# Patient Record
Sex: Male | Born: 1975 | Race: White | Hispanic: Yes | Marital: Married | Smoking: Current some day smoker
Health system: Southern US, Community
[De-identification: ages and names within clinical notes are randomized; demographics above are authoritative.]

## PROBLEM LIST (undated history)

## (undated) ENCOUNTER — Emergency Department (HOSPITAL_COMMUNITY): Payer: No Typology Code available for payment source

## (undated) DIAGNOSIS — E785 Hyperlipidemia, unspecified: Secondary | ICD-10-CM

## (undated) HISTORY — PX: NO PAST SURGERIES: SHX2092

## (undated) HISTORY — DX: Hyperlipidemia, unspecified: E78.5

---

## 2017-12-16 ENCOUNTER — Ambulatory Visit: Payer: Self-pay | Attending: Family Medicine | Admitting: *Deleted

## 2019-03-06 ENCOUNTER — Ambulatory Visit: Payer: Self-pay | Admitting: Emergency Medicine

## 2019-03-07 ENCOUNTER — Encounter: Payer: Self-pay | Admitting: Emergency Medicine

## 2019-03-08 ENCOUNTER — Encounter: Payer: Self-pay | Admitting: Emergency Medicine

## 2020-04-28 ENCOUNTER — Emergency Department (HOSPITAL_COMMUNITY)
Admission: EM | Admit: 2020-04-28 | Discharge: 2020-04-29 | Disposition: A | Discharge status: Home / Self Care | Payer: Self-pay | Attending: Emergency Medicine | Admitting: Emergency Medicine

## 2020-04-28 ENCOUNTER — Emergency Department (HOSPITAL_COMMUNITY): Payer: Self-pay

## 2020-04-28 DIAGNOSIS — R079 Chest pain, unspecified: Secondary | ICD-10-CM | POA: Insufficient documentation

## 2020-04-28 DIAGNOSIS — R9431 Abnormal electrocardiogram [ECG] [EKG]: Secondary | ICD-10-CM | POA: Insufficient documentation

## 2020-04-28 DIAGNOSIS — Z5321 Procedure and treatment not carried out due to patient leaving prior to being seen by health care provider: Secondary | ICD-10-CM | POA: Insufficient documentation

## 2020-04-28 LAB — CBC
HCT: 44.9 % (ref 39.0–52.0)
Hemoglobin: 15.4 g/dL (ref 13.0–17.0)
MCH: 31 pg (ref 26.0–34.0)
MCHC: 34.3 g/dL (ref 30.0–36.0)
MCV: 90.3 fL (ref 80.0–100.0)
Platelets: 278 10*3/uL (ref 150–400)
RBC: 4.97 MIL/uL (ref 4.22–5.81)
RDW: 11.8 % (ref 11.5–15.5)
WBC: 6 10*3/uL (ref 4.0–10.5)
nRBC: 0 % (ref 0.0–0.2)

## 2020-04-28 LAB — BASIC METABOLIC PANEL
Anion gap: 10 (ref 5–15)
BUN: 13 mg/dL (ref 6–20)
CO2: 25 mmol/L (ref 22–32)
Calcium: 9.1 mg/dL (ref 8.9–10.3)
Chloride: 104 mmol/L (ref 98–111)
Creatinine, Ser: 0.81 mg/dL (ref 0.61–1.24)
GFR, Estimated: >60 mL/min (ref >60)
Glucose, Bld: 97 mg/dL (ref 70–99)
Potassium: 3.8 mmol/L (ref 3.5–5.1)
Sodium: 139 mmol/L (ref 135–145)

## 2020-04-28 LAB — TROPONIN I (HIGH SENSITIVITY)
Troponin I (High Sensitivity): <2 ng/L (ref <18)
Troponin I (High Sensitivity): <2 ng/L (ref <18)

## 2020-04-28 NOTE — ED Triage Notes (Signed)
Pt arrives via [facility] from MD office with chief complaint with CP ongoing for months- was sent here due to abnormal ekg and none to compare too. Currently has no pain and has been pain free for 1 week.   Received no meds with ems, no ekg done with ems.

## 2020-04-29 NOTE — ED Notes (Signed)
Pt called for vitals x2, no answer 

## 2020-09-29 ENCOUNTER — Emergency Department (HOSPITAL_COMMUNITY): Payer: Self-pay

## 2020-09-29 ENCOUNTER — Emergency Department (HOSPITAL_COMMUNITY)
Admission: EM | Admit: 2020-09-29 | Discharge: 2020-09-29 | Disposition: A | Discharge status: Home / Self Care | Payer: Self-pay | Attending: Emergency Medicine | Admitting: Emergency Medicine

## 2020-09-29 ENCOUNTER — Other Ambulatory Visit: Payer: Self-pay

## 2020-09-29 DIAGNOSIS — W1830XA Fall on same level, unspecified, initial encounter: Secondary | ICD-10-CM | POA: Insufficient documentation

## 2020-09-29 DIAGNOSIS — M25511 Pain in right shoulder: Secondary | ICD-10-CM | POA: Insufficient documentation

## 2020-09-29 DIAGNOSIS — M25521 Pain in right elbow: Secondary | ICD-10-CM | POA: Insufficient documentation

## 2020-09-29 DIAGNOSIS — Y990 Civilian activity done for income or pay: Secondary | ICD-10-CM | POA: Insufficient documentation

## 2020-09-29 MED ORDER — NAPROXEN 500 MG PO TABS: 500.0000 mg | ORAL_TABLET | Freq: Two times a day (BID) | ORAL | 0 refills | Status: AC

## 2020-09-29 NOTE — ED Provider Notes (Signed)
[facility] EMERGENCY DEPARTMENT Provider Note   CSN: [id number] Arrival date & time: 09/29/20  1647     History Chief Complaint  Patient presents with  . Arm Pain    Harry Thomas is a 45 y.o. male.  HPI   Patient is a 45 year old male presenting with right shoulder and right elbow pain x 1 week. Reports he fell on outstretched right arm while at work. He fell from flat ground onto gravel. He did not hit his head. He states he has taken advil for the pain but without much relief. He reports limited ROM and that he can't raise his arm above his head. The pain is worsened by movement, and is constant and sharp.    Patient speaks Spanish. Translation services were utilized.   No past medical history on file.  There are no problems to display for this patient.        No family history on file.     Home Medications Prior to Admission medications   Not on File    Allergies    Patient has no known allergies.  Review of Systems   Review of Systems  Constitutional: Negative for chills and fever.  Musculoskeletal: Positive for myalgias.  Neurological: Negative for tremors, weakness and numbness.    Physical Exam Updated Vital Signs BP 125/82 (BP Location: Right Arm)   Pulse 88   Temp 97.9 F (36.6 C) (Oral)   Resp 15   SpO2 100%   Physical Exam Vitals and nursing note reviewed. Exam conducted with a chaperone present.  Constitutional:      General: He is not in acute distress.    Appearance: Normal appearance.     Comments: Patient sitting comfortable.  HENT:     Head: Normocephalic and atraumatic.  Eyes:     General: No scleral icterus.       Right eye: No discharge.        Left eye: No discharge.     Extraocular Movements: Extraocular movements intact.     Pupils: Pupils are equal, round, and reactive to light.  Cardiovascular:     Rate and Rhythm: Normal rate and regular rhythm.     Pulses: Normal pulses.     Heart sounds: Normal  heart sounds. No murmur heard. No friction rub. No gallop.      Comments: Brachial pulses 2+. Equal bilaterally Pulmonary:     Effort: Pulmonary effort is normal. No respiratory distress.     Breath sounds: Normal breath sounds.  Abdominal:     General: Abdomen is flat. Bowel sounds are normal. There is no distension.     Palpations: Abdomen is soft.     Tenderness: There is no abdominal tenderness.  Musculoskeletal:        General: Tenderness present.     Comments: ROM Shoulder: limited abduction and adduction to pain.  ROM Elbow: ROM nl. Some pain with flexion. No crepitus, no swelling, no obvious deformity.  AC joint is TTP.  Skin:    General: Skin is warm and dry.     Coloration: Skin is not jaundiced.  Neurological:     General: No focal deficit present.     Mental Status: He is alert. Mental status is at baseline.     Sensory: No sensory deficit.     Motor: No weakness.     Coordination: Coordination normal.     ED Results / Procedures / Treatments   Labs (all labs ordered  are listed, but only abnormal results are displayed) Labs Reviewed - No data to display  EKG None  Radiology No results found.  Procedures Procedures   Medications Ordered in ED Medications - No data to display  ED Course  I have reviewed the triage vital signs and the nursing notes.  Pertinent labs & imaging results that were available during my care of the patient were reviewed by me and considered in my medical decision making (see chart for details).    MDM Rules/Calculators/A&P                         Vitals stable, PE reassuring. Xrays without evidence of acute fracture. Discharged patient on 2 weeks of naproxen with plans to have him follow up with PCP at home. Return precautions given. Patient voiced understanding and agreement with the plan.   Final Clinical Impression(s) / ED Diagnoses Final diagnoses:  None    Rx / DC Orders ED Discharge Orders    None       Theron Arista, Cordelia Poche 10/03/20 1709    Eber Hong, MD 10/07/20 2112

## 2020-09-29 NOTE — ED Provider Notes (Signed)
The pt fell on outstretched hand last week - has pain in the shoulder and elbow - xrays reviewed and show no acute fractures on my read - normal alignment of joints and normal pulses, no signs of spinal ttp - normal gait - interpreter used.  Awaiting radiology read - no distress, anticipate d/c.  Medical screening examination/treatment/procedure(s) were conducted as a shared visit with non-physician practitioner(s) and myself.  I personally evaluated the patient during the encounter.  Clinical Impression:   Final diagnoses:  Right elbow pain  Right shoulder pain, unspecified chronicity         Eber Hong, MD 09/30/20 1540

## 2020-09-29 NOTE — ED Provider Notes (Signed)
Emergency Medicine Provider Triage Evaluation Note  Harry Thomas , a 45 y.o. male  was evaluated in triage.  Pt complains of right shoulder and elbow pain after fall at work 1 week ago, tripped while waiting and fell onto arm.  Patient is right-hand dominant.  Taking ibuprofen at home however continues to have pain worse with movement..  Review of Systems  Positive: Arm pain Negative: Weakness, numbness  Physical Exam  BP 125/82 (BP Location: Right Arm)   Pulse 88   Temp 97.9 F (36.6 C) (Oral)   Resp 15   SpO2 100%  Gen:   Awake, no distress   Resp:  Normal effort  MSK:   Moves extremities without difficulty  Other:    Medical Decision Making  Medically screening exam initiated at 5:24 PM.  Appropriate orders placed.  Harry Thomas was informed that the remainder of the evaluation will be completed by another provider, this initial triage assessment does not replace that evaluation, and the importance of remaining in the ED until their evaluation is complete.     Jeannie Fend, PA-C 09/29/20 1724    Milagros Loll, MD 09/30/20 [phone]

## 2020-09-29 NOTE — ED Triage Notes (Signed)
Pt here with reports of falling onto R arm last week. Continued pain and decreased ROM.

## 2020-09-29 NOTE — Discharge Instructions (Addendum)
You were seen today for shoulder and arm pain. Your plain film xrays did not show any signs of fracture to the bone. I am prescribing you Naproxen, which is an antiinflammatory medicine to take for the muscle pain. Take it twice daily, once in the morning and once at night. Please return if you develop a fever or if the pain worsens.   Lo vieron hoy por dolor en el hombro y el brazo. Sus radiografas simples no mostraron ningn signo de Development worker, community. Le estoy recetando naproxeno, que es un medicamento antiinflamatorio para Psychiatric nurse. Tmelo dos veces al da, una vez por la maana y otra por la noche. Regrese si tiene fiebre o si el dolor empeora.

## 2020-09-29 NOTE — ED Notes (Signed)
Pt discharged and ambulated out of the ED without difficulty. 

## 2021-05-12 NOTE — Progress Notes (Signed)
This encounter was created in error - please disregard.

## 2021-06-07 ENCOUNTER — Other Ambulatory Visit: Payer: Self-pay

## 2021-06-07 ENCOUNTER — Emergency Department (HOSPITAL_COMMUNITY): Payer: Self-pay

## 2021-06-07 ENCOUNTER — Emergency Department (HOSPITAL_COMMUNITY)
Admission: EM | Admit: 2021-06-07 | Discharge: 2021-06-07 | Disposition: A | Discharge status: Home / Self Care | Payer: Self-pay | Attending: Emergency Medicine | Admitting: Emergency Medicine

## 2021-06-07 ENCOUNTER — Encounter (HOSPITAL_COMMUNITY): Payer: Self-pay | Admitting: Emergency Medicine

## 2021-06-07 DIAGNOSIS — S3992XA Unspecified injury of lower back, initial encounter: Secondary | ICD-10-CM

## 2021-06-07 DIAGNOSIS — S300XXA Contusion of lower back and pelvis, initial encounter: Secondary | ICD-10-CM | POA: Insufficient documentation

## 2021-06-07 DIAGNOSIS — W231XXA Caught, crushed, jammed, or pinched between stationary objects, initial encounter: Secondary | ICD-10-CM | POA: Insufficient documentation

## 2021-06-07 LAB — URINALYSIS, ROUTINE W REFLEX MICROSCOPIC
Bilirubin Urine: NEGATIVE
Glucose, UA: NEGATIVE mg/dL
Hgb urine dipstick: NEGATIVE
Ketones, ur: NEGATIVE mg/dL
Leukocytes,Ua: NEGATIVE
Nitrite: NEGATIVE
Protein, ur: NEGATIVE mg/dL
Specific Gravity, Urine: 1.01 (ref 1.005–1.030)
pH: 6 (ref 5.0–8.0)

## 2021-06-07 MED ORDER — ACETAMINOPHEN 325 MG PO TABS: 650.0000 mg | ORAL_TABLET | Freq: Four times a day (QID) | ORAL | 0 refills | Status: AC | PRN

## 2021-06-07 MED ORDER — LIDOCAINE 5 % EX PTCH: 1.0000 | MEDICATED_PATCH | Freq: Every day | CUTANEOUS | 0 refills | Status: AC | PRN

## 2021-06-07 MED ORDER — KETOROLAC TROMETHAMINE 60 MG/2ML IM SOLN
60.0000 mg | Freq: Once | INTRAMUSCULAR | Status: AC
  Administered 2021-06-07: 60 mg via INTRAMUSCULAR
  Filled 2021-06-07: qty 2

## 2021-06-07 MED ORDER — IBUPROFEN 600 MG PO TABS: 600.0000 mg | ORAL_TABLET | Freq: Four times a day (QID) | ORAL | 0 refills | Status: DC | PRN

## 2021-06-07 MED ORDER — HYDROCODONE-ACETAMINOPHEN 5-325 MG PO TABS
1.0000 | ORAL_TABLET | Freq: Once | ORAL | Status: AC
  Administered 2021-06-07: 1 via ORAL
  Filled 2021-06-07: qty 1

## 2021-06-07 MED ORDER — OXYCODONE HCL 5 MG PO TABS: 5.0000 mg | ORAL_TABLET | ORAL | 0 refills | Status: AC | PRN

## 2021-06-07 MED ORDER — LIDOCAINE 5 % EX PTCH
1.0000 | MEDICATED_PATCH | CUTANEOUS | Status: DC
  Administered 2021-06-07: 1 via TRANSDERMAL
  Filled 2021-06-07: qty 1

## 2021-06-07 MED ORDER — METHOCARBAMOL 500 MG PO TABS: 1000.0000 mg | ORAL_TABLET | Freq: Two times a day (BID) | ORAL | 0 refills | Status: AC

## 2021-06-07 NOTE — ED Triage Notes (Signed)
[employer] used for triage.  PT states he was carrying a washer from the 2nd floor yesterday.  The washer slipped from the person on tops hands and the washer fell on pt's stomach.  C/o lower back pain.  Taking Ibuprofen 800mg  without relief.  Denies hematuria.  Reports pain with urination.

## 2021-06-07 NOTE — ED Provider Notes (Addendum)
[facility] EMERGENCY DEPARTMENT Provider Note   CSN: [id number] Arrival date & time: 06/07/21  1239     History  Chief Complaint  Patient presents with   Back Pain    Harry Thomas is a 46 y.o. male.  This is a 46 y.o. male without significant medical history as below who presents to the ED with complaint of back injury.  Patient reports yesterday he was moving a washing machine in a domicile.  Wash machine fell down the stairs and pinned the patient between the wall and the machine.  He experienced pain to his abdomen and low back following this event.  He is able to ambulate following the accident.  No numbness or tingling, no loss of bowel or bladder control, no LOC.  No vomiting.  No hematuria or blood per rectum.  No chest pain or dyspnea.  Pain has been progressively worsening since the onset yesterday.  He is ambulating but having some difficulty with walking secondary to the ongoing pain.  No history of prior spinal surgeries.  No fevers or chills.  Pain described as aching, sharp, stabbing.  No numbness down the legs.  No other acute complaints offered   Spanish interpreter utilized for evaluation/history    History reviewed. No pertinent past medical history.     The history is provided by the patient. The history is limited by a language barrier. A language interpreter was used.  Back Pain Associated symptoms: no abdominal pain, no chest pain, no fever and no headaches       Home Medications Prior to Admission medications   Medication Sig Start Date End Date Taking? Authorizing Provider  acetaminophen (TYLENOL) 325 MG tablet Take 2 tablets (650 mg total) by mouth every 6 (six) hours as needed. 06/07/21  Yes Wynona Dove A, DO  ibuprofen (ADVIL) 600 MG tablet Take 1 tablet (600 mg total) by mouth every 6 (six) hours as needed. 06/07/21  Yes Wynona Dove A, DO  lidocaine (LIDODERM) 5 % Place 1 patch onto the skin daily as needed for up to 15 doses.  Remove & Discard patch within 12 hours or as directed by MD 06/07/21  Yes Jeanell Sparrow, DO  methocarbamol (ROBAXIN) 500 MG tablet Take 2 tablets (1,000 mg total) by mouth 2 (two) times daily for 5 days. 06/07/21 06/12/21 Yes Wynona Dove A, DO  oxyCODONE (ROXICODONE) 5 MG immediate release tablet Take 1 tablet (5 mg total) by mouth every 4 (four) hours as needed for severe pain. 06/07/21  Yes Jeanell Sparrow, DO  multivitamin (ONE-A-DAY MEN'S) TABS tablet Take 1 tablet by mouth daily with breakfast.    [provider]  naproxen (NAPROSYN) 500 MG tablet Take 1 tablet (500 mg total) by mouth 2 (two) times daily. 09/29/20   Sherrill Raring, PA-C      Allergies    Other    Review of Systems   Review of Systems  Constitutional:  Negative for chills and fever.  HENT:  Negative for facial swelling and trouble swallowing.   Eyes:  Negative for photophobia and visual disturbance.  Respiratory:  Negative for cough and shortness of breath.   Cardiovascular:  Negative for chest pain and palpitations.  Gastrointestinal:  Negative for abdominal pain, nausea and vomiting.  Endocrine: Negative for polydipsia and polyuria.  Genitourinary:  Negative for frequency, hematuria, penile swelling, scrotal swelling and testicular pain.  Musculoskeletal:  Positive for arthralgias, back pain and gait problem. Negative for joint swelling.  Skin:  Negative for pallor and rash.  Neurological:  Negative for syncope and headaches.  Psychiatric/Behavioral:  Negative for agitation and confusion.    Physical Exam Updated Vital Signs BP 103/81 (BP Location: Right Arm)    Pulse 66    Temp 97.9 F (36.6 C) (Oral)    Resp 16    SpO2 100%  Physical Exam Vitals and nursing note reviewed.  Constitutional:      General: He is not in acute distress.    Appearance: Normal appearance. He is well-developed. He is not ill-appearing, toxic-appearing or diaphoretic.  HENT:     Head: Normocephalic and atraumatic.     Right Ear:  External ear normal.     Left Ear: External ear normal.     Mouth/Throat:     Mouth: Mucous membranes are moist.  Eyes:     General: No scleral icterus.    Extraocular Movements: Extraocular movements intact.     Pupils: Pupils are equal, round, and reactive to light.  Cardiovascular:     Rate and Rhythm: Normal rate and regular rhythm.     Pulses: Normal pulses.     Heart sounds: Normal heart sounds.  Pulmonary:     Effort: Pulmonary effort is normal. No respiratory distress.     Breath sounds: Normal breath sounds.  Abdominal:     General: Abdomen is flat.     Palpations: Abdomen is soft.     Tenderness: There is no abdominal tenderness. There is no guarding or rebound.  Musculoskeletal:        General: Normal range of motion.       Arms:     Cervical back: Normal range of motion.     Right lower leg: No edema.     Left lower leg: No edema.     Comments: Hematoma to lumbar spine.  Area is tender palpation.  No tissue breakdown.  No crepitus or step-off.  Rectal tone intact  Skin:    General: Skin is warm and dry.     Capillary Refill: Capillary refill takes less than 2 seconds.  Neurological:     General: No focal deficit present.     Mental Status: He is alert and oriented to person, place, and time.     GCS: GCS eye subscore is 4. GCS verbal subscore is 5. GCS motor subscore is 6.     Cranial Nerves: Cranial nerves 2-12 are intact. No dysarthria.     Sensory: Sensation is intact.     Motor: Motor function is intact. No tremor.     Coordination: Coordination is intact.     Gait: Gait abnormal (slight antalgic gait).  Psychiatric:        Mood and Affect: Mood normal.        Behavior: Behavior normal.    ED Results / Procedures / Treatments   Labs (all labs ordered are listed, but only abnormal results are displayed) Labs Reviewed  URINALYSIS, ROUTINE W REFLEX MICROSCOPIC    EKG None  Radiology CT ABDOMEN PELVIS WO CONTRAST  Result Date: 06/07/2021 CLINICAL  DATA:  Lower back pain, dysuria, heavy object fell on him yesterday EXAM: CT ABDOMEN AND PELVIS WITHOUT CONTRAST TECHNIQUE: Multidetector CT imaging of the abdomen and pelvis was performed following the standard protocol without IV contrast. Unenhanced CT was performed per clinician order. Lack of IV contrast limits sensitivity and specificity, especially for evaluation of abdominal/pelvic solid viscera. RADIATION DOSE REDUCTION: This exam was performed according to the departmental dose-optimization program  which includes automated exposure control, adjustment of the mA and/or kV according to patient size and/or use of iterative reconstruction technique. COMPARISON:  None. FINDINGS: Lower chest: No acute pleural or parenchymal lung disease. Hepatobiliary: Unremarkable unenhanced appearance of the liver and gallbladder. Pancreas: Unremarkable unenhanced appearance. Spleen: Unremarkable unenhanced appearance. Adrenals/Urinary Tract: No urinary tract calculi or obstructive uropathy. The adrenals and bladder are unremarkable. Stomach/Bowel: No bowel obstruction or ileus. Normal appendix right lower quadrant. No bowel wall thickening or inflammatory change. Vascular/Lymphatic: No significant vascular findings are present. No enlarged abdominal or pelvic lymph nodes. Reproductive: Prostate is unremarkable. Other: No free intraperitoneal fluid or free gas. No abdominal wall hernia. Musculoskeletal: No acute or destructive bony lesions. There is subcutaneous edema within the midline lower back. Reconstructed images demonstrate no additional findings. IMPRESSION: 1. Subcutaneous edema midline lower back, consistent with history of trauma. No fluid collection or evidence of hematoma. 2. No acute displaced fracture. 3. Otherwise unremarkable unenhanced CT of the abdomen and pelvis. Electronically Signed   By: Randa Ngo M.D.   On: 06/07/2021 16:31   DG Lumbar Spine Complete  Result Date: 06/07/2021 CLINICAL DATA:   Back pain post blunt trauma. EXAM: LUMBAR SPINE - COMPLETE 4+ VIEW COMPARISON:  None. FINDINGS: There is no evidence of lumbar spine fracture. Alignment is normal. Intervertebral disc spaces are maintained. IMPRESSION: Negative. Electronically Signed   By: Fidela Salisbury M.D.   On: 06/07/2021 13:51    Procedures Procedures    Medications Ordered in ED Medications  ketorolac (TORADOL) injection 60 mg (has no administration in time range)  lidocaine (LIDODERM) 5 % 1 patch (has no administration in time range)  HYDROcodone-acetaminophen (NORCO/VICODIN) 5-325 MG per tablet 1 tablet (1 tablet Oral Given 06/07/21 1541)    ED Course/ Medical Decision Making/ A&P                           Medical Decision Making Problems Addressed: Injury of low back, initial encounter: acute illness or injury  Amount and/or Complexity of Data Reviewed Labs: ordered. Decision-making details documented in ED Course.    Details: UA - negative Radiology: ordered and independent interpretation performed. Decision-making details documented in ED Course.  Risk OTC drugs. Prescription drug management.    CC: Back injury  This patient complains of back injury; this involves an extensive number of treatment options and is a complaint that carries with it a high risk of complications and morbidity. Vital signs were reviewed. Serious etiologies considered.  Record review:  Previous records obtained and reviewed    Work up as above, notable for:  Labs & imaging results that were available during my care of the patient were reviewed by me and considered in my medical decision making.   Patient with midline tenderness palpation to lumbar spine.  Recent trauma.  Lumbar x-ray nonacute.  Will obtain CT imaging  I ordered imaging studies which included CT lumbar, CT abdomen pelvis and I independently visualized and interpreted imaging which showed subcutaneous edema midline lower back.  No  fracture.  Management: Patient given oral analgesics, Toradol, lidocaine patch  Reassessment:  Patient reports symptoms have improved following intervention.  He is ambulatory, he is tolerating oral intake.  He is neurologically intact.  Patient with swelling secondary to recent traumatic injury.  Pain likely due to soft tissue injury associated with trauma.  Discussed symptomatic care at home and strict return precautions.  Close outpatient follow-up with PCP.  Patient presents  with low back pain without signs of spinal cord compression, cauda equina syndrome, infection, aneurysm, or other serious etiology. The patient is neurologically intact. Given the extremely low risk of these diagnoses further testing and evaluation for these possibilities does not appear to be indicated at this time. Detailed discussions were had with the patient and/or family and caregivers, regarding current findings, and need for close f/u with PCP or on call doctor. The patient has been instructed to return immediately if the symptoms worsen in any way. Patient verbalized understanding and is in agreement with current care plan. All questions answered prior to discharge.   Social determinants of health-tobacco use, Spanish-speaking        This chart was dictated using Armed forces training and education officer.  Despite best efforts to proofread,  errors can occur which can change the documentation meaning.         Final Clinical Impression(s) / ED Diagnoses Final diagnoses:  Injury of low back, initial encounter    Rx / DC Orders ED Discharge Orders          Ordered    methocarbamol (ROBAXIN) 500 MG tablet  2 times daily        06/07/21 1717    lidocaine (LIDODERM) 5 %  Daily PRN        06/07/21 1717    ibuprofen (ADVIL) 600 MG tablet  Every 6 hours PRN        06/07/21 1717    acetaminophen (TYLENOL) 325 MG tablet  Every 6 hours PRN        06/07/21 1717    oxyCODONE (ROXICODONE) 5 MG immediate release  tablet  Every 4 hours PRN        06/07/21 1730              Jeanell Sparrow, DO 06/07/21 1739    Jeanell Sparrow, DO 06/07/21 1746

## 2021-06-07 NOTE — Discharge Instructions (Addendum)
°  Fue un placer atenderlo [employer] en el departamento de emergencias.  Regrese al departamento de emergencias si hay algn empeoramiento o sntomas preocupantes.   Haga un seguimiento con su mdico de atencin primaria dentro de la prxima semana. Tome sus medicamentos segn las instrucciones y Conservation officer, nature cambio en sus medicamentos con su mdico de atencin primaria.   Regrese al departamento de emergencias si tiene entumecimiento en las piernas, debilidad en las piernas, dificultad para caminar, fiebre, empeoramiento del [place], [place], prdida del conocimiento, dolor abdominal intenso, dolor de cabeza intenso, dificultad para orinar o dificultad para defecar.   Regrese al departamento de emergencias de inmediato por cualquier sntoma nuevo o preocupante, o si empeora.

## 2021-06-07 NOTE — ED Provider Triage Note (Signed)
Emergency Medicine Provider Triage Evaluation Note  Harry Thomas , a 46 y.o. male  was evaluated in triage.  Pt complains of lower back pain after a wash machine fell on him yesterday.  Patient states he was moving a washer when they lost control and he fell backwards and fell onto him walking down some stairs.  He denies any weakness or numbness to his legs, bladder incontinence, bowel incontinence.  Does report associated dysuria.  Review of Systems  Positive:  Negative: See above   Physical Exam  There were no vitals taken for this visit. Gen:   Awake, no distress   Resp:  Normal effort  MSK:   Moves extremities without difficulty  Other:  Midline lumbar spinal tenderness  Medical Decision Making  Medically screening exam initiated at 1:30 PM.  Appropriate orders placed.  Harry Thomas was informed that the remainder of the evaluation will be completed by another provider, this initial triage assessment does not replace that evaluation, and the importance of remaining in the ED until their evaluation is complete.     Harry Thomas, [place] 06/07/21 1331

## 2021-09-21 ENCOUNTER — Other Ambulatory Visit: Payer: Self-pay | Admitting: Obstetrics and Gynecology

## 2021-09-21 ENCOUNTER — Ambulatory Visit
Admission: RE | Admit: 2021-09-21 | Discharge: 2021-09-21 | Disposition: A | Discharge status: Home / Self Care | Payer: Self-pay | Source: Ambulatory Visit | Attending: Obstetrics and Gynecology | Admitting: Obstetrics and Gynecology

## 2021-09-21 DIAGNOSIS — R7611 Nonspecific reaction to tuberculin skin test without active tuberculosis: Secondary | ICD-10-CM

## 2022-06-08 DIAGNOSIS — M549 Dorsalgia, unspecified: Secondary | ICD-10-CM | POA: Insufficient documentation

## 2022-06-08 DIAGNOSIS — M25512 Pain in left shoulder: Secondary | ICD-10-CM | POA: Insufficient documentation

## 2022-06-08 DIAGNOSIS — Y92410 Unspecified street and highway as the place of occurrence of the external cause: Secondary | ICD-10-CM | POA: Insufficient documentation

## 2022-06-09 ENCOUNTER — Emergency Department (HOSPITAL_COMMUNITY): Payer: No Typology Code available for payment source

## 2022-06-09 ENCOUNTER — Emergency Department (HOSPITAL_COMMUNITY)
Admission: EM | Admit: 2022-06-09 | Discharge: 2022-06-09 | Disposition: A | Discharge status: Home / Self Care | Payer: No Typology Code available for payment source | Attending: Emergency Medicine | Admitting: Emergency Medicine

## 2022-06-09 ENCOUNTER — Other Ambulatory Visit: Payer: Self-pay

## 2022-06-09 ENCOUNTER — Encounter (HOSPITAL_COMMUNITY): Payer: Self-pay | Admitting: Emergency Medicine

## 2022-06-09 MED ORDER — ACETAMINOPHEN 500 MG PO TABS
1000.0000 mg | ORAL_TABLET | ORAL | Status: AC
  Administered 2022-06-09: 1000 mg via ORAL
  Filled 2022-06-09: qty 2

## 2022-06-09 MED ORDER — METHOCARBAMOL 500 MG PO TABS: 500.0000 mg | ORAL_TABLET | Freq: Two times a day (BID) | ORAL | 0 refills | Status: AC
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
## 2022-06-09 NOTE — Discharge Instructions (Signed)
Your xrays are without any fractures. As we discussed, a CT scan could be used to ensure that there is not a fracture.  Take Tylenol Motrin as discussed below.  Use the muscle relaxer Robaxin.  Hydrate, use warm compresses and lidocaine patches Tiger balm or IcyHot.  Please use Tylenol or ibuprofen for pain.  You may use 600 mg ibuprofen every 6 hours or 1000 mg of Tylenol every 6 hours.  You may choose to alternate between the 2.  This would be most effective.  Not to exceed 4 g of Tylenol within 24 hours.  Not to exceed 3200 mg ibuprofen 24 hours.
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
## 2022-09-02 ENCOUNTER — Emergency Department (HOSPITAL_COMMUNITY)
Admission: EM | Admit: 2022-09-02 | Discharge: 2022-09-02 | Disposition: A | Discharge status: Home / Self Care | Payer: No Typology Code available for payment source | Attending: Emergency Medicine | Admitting: Emergency Medicine

## 2022-09-02 ENCOUNTER — Emergency Department (HOSPITAL_COMMUNITY): Payer: No Typology Code available for payment source

## 2022-09-02 ENCOUNTER — Encounter (HOSPITAL_COMMUNITY): Payer: Self-pay

## 2022-09-02 DIAGNOSIS — S60022A Contusion of left index finger without damage to nail, initial encounter: Secondary | ICD-10-CM | POA: Insufficient documentation

## 2022-09-02 DIAGNOSIS — S3992XA Unspecified injury of lower back, initial encounter: Secondary | ICD-10-CM | POA: Diagnosis present

## 2022-09-02 DIAGNOSIS — S39012A Strain of muscle, fascia and tendon of lower back, initial encounter: Secondary | ICD-10-CM | POA: Insufficient documentation

## 2022-09-02 DIAGNOSIS — Y92410 Unspecified street and highway as the place of occurrence of the external cause: Secondary | ICD-10-CM | POA: Insufficient documentation

## 2022-09-02 MED ORDER — IBUPROFEN 800 MG PO TABS: 800.0000 mg | ORAL_TABLET | Freq: Three times a day (TID) | ORAL | 0 refills | Status: DC | PRN

## 2022-09-02 MED ORDER — IBUPROFEN 800 MG PO TABS: 800.0000 mg | ORAL_TABLET | Freq: Three times a day (TID) | ORAL | 0 refills | Status: AC | PRN
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
## 2022-09-02 NOTE — Discharge Instructions (Signed)
Follow-up with your family doctor for any problems.  And get your blood pressure rechecked in the next 2 to 3 weeks because your blood pressure is mildly elevated
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
## 2022-09-05 ENCOUNTER — Encounter (HOSPITAL_COMMUNITY): Payer: Self-pay

## 2022-09-05 ENCOUNTER — Emergency Department (HOSPITAL_COMMUNITY)
Admission: EM | Admit: 2022-09-05 | Discharge: 2022-09-05 | Disposition: A | Discharge status: Home / Self Care | Payer: No Typology Code available for payment source | Attending: Emergency Medicine | Admitting: Emergency Medicine

## 2022-09-05 ENCOUNTER — Emergency Department (HOSPITAL_COMMUNITY): Payer: No Typology Code available for payment source

## 2022-09-05 DIAGNOSIS — M542 Cervicalgia: Secondary | ICD-10-CM | POA: Diagnosis not present

## 2022-09-05 DIAGNOSIS — S39012A Strain of muscle, fascia and tendon of lower back, initial encounter: Secondary | ICD-10-CM

## 2022-09-05 DIAGNOSIS — M549 Dorsalgia, unspecified: Secondary | ICD-10-CM | POA: Diagnosis present

## 2022-09-05 DIAGNOSIS — M62830 Muscle spasm of back: Secondary | ICD-10-CM

## 2022-09-05 MED ORDER — OXYCODONE HCL 5 MG PO TABS
5.0000 mg | ORAL_TABLET | Freq: Once | ORAL | Status: AC
  Administered 2022-09-05: 5 mg via ORAL
  Filled 2022-09-05: qty 1

## 2022-09-05 MED ORDER — DIAZEPAM 2 MG PO TABS
2.0000 mg | ORAL_TABLET | Freq: Once | ORAL | Status: AC
  Administered 2022-09-05: 2 mg via ORAL
  Filled 2022-09-05: qty 1

## 2022-09-05 MED ORDER — DIAZEPAM 5 MG PO TABS: 2.5000 mg | ORAL_TABLET | Freq: Four times a day (QID) | ORAL | 0 refills | Status: AC | PRN

## 2022-09-05 MED ORDER — OXYCODONE HCL 5 MG PO TABS: 2.5000 mg | ORAL_TABLET | ORAL | 0 refills | Status: AC | PRN
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
## 2022-09-05 NOTE — Discharge Instructions (Signed)
Comunquese con un mdico si: El dolor de espalda no mejora despus de varias semanas de [place]. Sus sntomas empeoran. Tiene fiebre. Solicite ayuda de inmediato si: El dolor de espalda es muy intenso. Nota que no puede pararse o caminar. Siente dolor en las piernas. Siente debilidad en las nalgas o en las piernas. Tiene problemas para controlar la miccin o las deposiciones. Tiene miccin frecuente, dolorosa o con sangre. Esta informacin no tiene Theme park manager el consejo del mdico. Asegrese de hacerle al mdico cualquier pregunta que tenga.
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
## 2022-09-29 ENCOUNTER — Ambulatory Visit: Payer: No Typology Code available for payment source | Attending: Student

## 2022-09-29 DIAGNOSIS — M5459 Other low back pain: Secondary | ICD-10-CM | POA: Diagnosis present

## 2022-09-29 DIAGNOSIS — M25551 Pain in right hip: Secondary | ICD-10-CM | POA: Diagnosis present

## 2022-09-29 DIAGNOSIS — M6281 Muscle weakness (generalized): Secondary | ICD-10-CM | POA: Diagnosis present
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
## 2022-10-12 ENCOUNTER — Ambulatory Visit: Payer: No Typology Code available for payment source

## 2022-10-12 DIAGNOSIS — M6281 Muscle weakness (generalized): Secondary | ICD-10-CM

## 2022-10-12 DIAGNOSIS — M25551 Pain in right hip: Secondary | ICD-10-CM

## 2022-10-12 DIAGNOSIS — M5459 Other low back pain: Secondary | ICD-10-CM

## 2022-10-14 ENCOUNTER — Ambulatory Visit: Payer: No Typology Code available for payment source

## 2022-10-14 DIAGNOSIS — M5459 Other low back pain: Secondary | ICD-10-CM | POA: Diagnosis not present

## 2022-10-14 DIAGNOSIS — M6281 Muscle weakness (generalized): Secondary | ICD-10-CM

## 2022-10-14 DIAGNOSIS — M25551 Pain in right hip: Secondary | ICD-10-CM
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
## 2022-10-20 ENCOUNTER — Telehealth: Payer: Self-pay

## 2022-10-20 ENCOUNTER — Ambulatory Visit: Payer: No Typology Code available for payment source

## 2022-10-20 DIAGNOSIS — M5459 Other low back pain: Secondary | ICD-10-CM

## 2022-10-20 DIAGNOSIS — M6281 Muscle weakness (generalized): Secondary | ICD-10-CM

## 2022-10-20 DIAGNOSIS — M25551 Pain in right hip: Secondary | ICD-10-CM

## 2022-10-20 NOTE — Telephone Encounter (Signed)
Spoke with patient through interpreter to discuss need to contact MD office regarding referral to [facility] to address L hand.  Patient expressed understanding and will f/u with any questions or concerns at future PT appointments.  He will reach out to PCP first, declined to contact [facility] directly.
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
## 2022-10-21 ENCOUNTER — Ambulatory Visit: Payer: No Typology Code available for payment source

## 2022-10-21 DIAGNOSIS — M6281 Muscle weakness (generalized): Secondary | ICD-10-CM

## 2022-10-21 DIAGNOSIS — M25551 Pain in right hip: Secondary | ICD-10-CM

## 2022-10-21 DIAGNOSIS — M5459 Other low back pain: Secondary | ICD-10-CM
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
## 2022-10-25 ENCOUNTER — Ambulatory Visit: Payer: Self-pay | Attending: Student

## 2022-10-25 DIAGNOSIS — R278 Other lack of coordination: Secondary | ICD-10-CM | POA: Insufficient documentation

## 2022-10-25 DIAGNOSIS — M79645 Pain in left finger(s): Secondary | ICD-10-CM | POA: Insufficient documentation

## 2022-10-25 DIAGNOSIS — M25551 Pain in right hip: Secondary | ICD-10-CM | POA: Insufficient documentation

## 2022-10-25 DIAGNOSIS — M25642 Stiffness of left hand, not elsewhere classified: Secondary | ICD-10-CM | POA: Insufficient documentation

## 2022-10-25 DIAGNOSIS — M5459 Other low back pain: Secondary | ICD-10-CM | POA: Insufficient documentation

## 2022-10-25 DIAGNOSIS — M6281 Muscle weakness (generalized): Secondary | ICD-10-CM | POA: Insufficient documentation

## 2022-10-26 ENCOUNTER — Ambulatory Visit (INDEPENDENT_AMBULATORY_CARE_PROVIDER_SITE_OTHER): Payer: Self-pay | Admitting: Orthopaedic Surgery

## 2022-10-26 DIAGNOSIS — M79642 Pain in left hand: Secondary | ICD-10-CM
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
## 2022-10-28 ENCOUNTER — Ambulatory Visit: Payer: Self-pay

## 2022-10-28 DIAGNOSIS — M5459 Other low back pain: Secondary | ICD-10-CM

## 2022-10-28 DIAGNOSIS — M25551 Pain in right hip: Secondary | ICD-10-CM

## 2022-10-28 DIAGNOSIS — M6281 Muscle weakness (generalized): Secondary | ICD-10-CM
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
## 2022-11-01 ENCOUNTER — Ambulatory Visit: Payer: Self-pay

## 2022-11-01 DIAGNOSIS — M5459 Other low back pain: Secondary | ICD-10-CM

## 2022-11-01 DIAGNOSIS — M6281 Muscle weakness (generalized): Secondary | ICD-10-CM

## 2022-11-01 DIAGNOSIS — M25551 Pain in right hip: Secondary | ICD-10-CM

## 2022-11-02 ENCOUNTER — Other Ambulatory Visit: Payer: Self-pay

## 2022-11-02 ENCOUNTER — Ambulatory Visit: Payer: Self-pay | Admitting: Occupational Therapy

## 2022-11-02 ENCOUNTER — Encounter: Payer: Self-pay | Admitting: Occupational Therapy

## 2022-11-02 DIAGNOSIS — R278 Other lack of coordination: Secondary | ICD-10-CM

## 2022-11-02 DIAGNOSIS — M79645 Pain in left finger(s): Secondary | ICD-10-CM

## 2022-11-02 DIAGNOSIS — M25642 Stiffness of left hand, not elsewhere classified: Secondary | ICD-10-CM

## 2022-11-02 DIAGNOSIS — M6281 Muscle weakness (generalized): Secondary | ICD-10-CM
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
## 2022-11-04 ENCOUNTER — Ambulatory Visit: Payer: Self-pay

## 2022-11-04 DIAGNOSIS — M25551 Pain in right hip: Secondary | ICD-10-CM

## 2022-11-04 DIAGNOSIS — M5459 Other low back pain: Secondary | ICD-10-CM

## 2022-11-04 DIAGNOSIS — M6281 Muscle weakness (generalized): Secondary | ICD-10-CM
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
## 2022-11-08 ENCOUNTER — Ambulatory Visit: Payer: Self-pay | Admitting: Occupational Therapy

## 2022-11-08 DIAGNOSIS — M79645 Pain in left finger(s): Secondary | ICD-10-CM

## 2022-11-08 DIAGNOSIS — M6281 Muscle weakness (generalized): Secondary | ICD-10-CM

## 2022-11-08 DIAGNOSIS — M25642 Stiffness of left hand, not elsewhere classified: Secondary | ICD-10-CM

## 2022-11-08 NOTE — Patient Instructions (Addendum)
Access Code: [email or web address] URL: [email or web address] Date: 11/08/2022 Prepared by: Amada Kingfisher  Exercises - Putty Squeezes  - 1 x daily - 2 sets - 10 reps - Tip PUSH with Putty  - 1 x daily - 2 sets - 10 reps - Finger Extension with Putty  - 1 x daily - 2 sets - 10 reps - Seated Finger Extension with Putty  - 1 x daily - 2 sets - 10 reps - Finger Strengthening: Hook and [employer] in Putty  - 1 x daily - 2 sets - 10 reps - 3-Point Pinch with Putty  - 1 x daily - 2 sets - 10 reps  Access Code: [email or web address] URL: [email or web address] Date: 11/08/2022 Prepared by: Amada Kingfisher  Exercises - Standing Forearm Pronation and Supination with Hammer  - 2 x daily - 10 reps
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
## 2022-11-09 ENCOUNTER — Ambulatory Visit: Payer: Self-pay | Admitting: Occupational Therapy

## 2022-11-09 ENCOUNTER — Ambulatory Visit: Payer: Self-pay

## 2022-11-09 DIAGNOSIS — R278 Other lack of coordination: Secondary | ICD-10-CM

## 2022-11-09 DIAGNOSIS — M5459 Other low back pain: Secondary | ICD-10-CM

## 2022-11-09 DIAGNOSIS — M79645 Pain in left finger(s): Secondary | ICD-10-CM

## 2022-11-09 DIAGNOSIS — M25642 Stiffness of left hand, not elsewhere classified: Secondary | ICD-10-CM

## 2022-11-09 DIAGNOSIS — M6281 Muscle weakness (generalized): Secondary | ICD-10-CM

## 2022-11-09 DIAGNOSIS — M25551 Pain in right hip: Secondary | ICD-10-CM
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
## 2022-11-09 NOTE — Patient Instructions (Addendum)
Finger ROM  Printed in Spanish for patient  Access Code: [email or web address] URL: [email or web address] Date: 11/09/2022 Prepared by: Amada Kingfisher  Exercises  - Seated Finger Composite Flexion Extension  - 2 x daily - 10 reps - Seated Claw Fist AROM  - 2 x daily - 10 reps - Seated Full Fist AROM  - 2 x daily - 10 reps   Coordination Activities  Perform the following activities for a few minutes a couple of times per day with left hand(s).  Rotate ball in fingertips (clockwise and counter-clockwise). Flip cards 1 at a time as fast as you can. Deal cards with your thumb (Hold deck in hand and push card off top with thumb). Shuffle cards. Pick up coins, buttons, marbles, dried beans/pasta of different sizes and place in container. Pick up coins and place in container or coin bank. Pick up coins one at a time until you get 5-10 in your hand, then move coins from palm to fingertips to stack one at a time. Twirl pen between fingers.    Spanish list Actividades de coordinacin    Biomedical scientist en las yemas de los dedos (en sentido horario y en sentido contrario).   Dar la vuelta a las cartas una a una tan rpido como puedas.   Repartir cartas con Multimedia programmer y el dedo (sostener la baraja en la mano y empujar la carta desde arriba con el pulgar).   Barajar las cartas.   Recoger monedas, botones, canicas, tuercas/tornillos de diferentes tamaos y colocarlos en un recipiente.   Recoge las monedas y colcalas en un recipiente o alcanca.   Toma las monedas una por una hasta [place] [address] y 10 en tu mano, luego [employer] las monedas de la palma a las yemas de los dedos para apilarlas una por [place].   Gira el bolgrafo [employer] dedos.
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
## 2022-11-15 ENCOUNTER — Ambulatory Visit: Payer: Self-pay | Admitting: Occupational Therapy

## 2022-11-15 ENCOUNTER — Ambulatory Visit: Payer: Self-pay

## 2022-11-15 DIAGNOSIS — M5459 Other low back pain: Secondary | ICD-10-CM

## 2022-11-15 DIAGNOSIS — M25642 Stiffness of left hand, not elsewhere classified: Secondary | ICD-10-CM

## 2022-11-15 DIAGNOSIS — M6281 Muscle weakness (generalized): Secondary | ICD-10-CM

## 2022-11-15 DIAGNOSIS — R278 Other lack of coordination: Secondary | ICD-10-CM

## 2022-11-15 DIAGNOSIS — M25551 Pain in right hip: Secondary | ICD-10-CM
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
## 2022-11-17 ENCOUNTER — Ambulatory Visit: Payer: Self-pay

## 2022-11-17 ENCOUNTER — Ambulatory Visit: Payer: Self-pay | Admitting: Occupational Therapy

## 2022-11-17 DIAGNOSIS — M6281 Muscle weakness (generalized): Secondary | ICD-10-CM

## 2022-11-17 DIAGNOSIS — M5459 Other low back pain: Secondary | ICD-10-CM

## 2022-11-17 DIAGNOSIS — R278 Other lack of coordination: Secondary | ICD-10-CM

## 2022-11-17 DIAGNOSIS — M25642 Stiffness of left hand, not elsewhere classified: Secondary | ICD-10-CM

## 2022-11-17 NOTE — Patient Instructions (Signed)
Theraband for UE strength and L UE grip  Access Code: [email or web address] URL: [email or web address] Date: 11/17/2022 Prepared by: Amada Kingfisher  Exercises - Seated Shoulder Flexion with Self-Anchored Resistance  - 1 x daily - 2 sets - 10 reps - Seated Elbow Flexion with Self-Anchored Resistance  - 1 x daily - 2 sets - 10 reps - Standing Lat Pull Down with Resistance - Elbows Bent  - 1 x daily - 2 sets - 10 reps
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
## 2022-11-22 ENCOUNTER — Ambulatory Visit: Payer: Self-pay | Attending: Student | Admitting: Occupational Therapy

## 2022-11-22 ENCOUNTER — Ambulatory Visit: Payer: Self-pay

## 2022-11-22 DIAGNOSIS — M25642 Stiffness of left hand, not elsewhere classified: Secondary | ICD-10-CM | POA: Insufficient documentation

## 2022-11-22 DIAGNOSIS — M5459 Other low back pain: Secondary | ICD-10-CM | POA: Insufficient documentation

## 2022-11-22 DIAGNOSIS — M79645 Pain in left finger(s): Secondary | ICD-10-CM | POA: Insufficient documentation

## 2022-11-22 DIAGNOSIS — M6281 Muscle weakness (generalized): Secondary | ICD-10-CM | POA: Insufficient documentation

## 2022-11-22 DIAGNOSIS — R278 Other lack of coordination: Secondary | ICD-10-CM | POA: Insufficient documentation
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
## 2022-11-22 NOTE — Patient Instructions (Signed)
Combined all previous exercises into 1 document. Added carrying exercise to simulate work tasks.  Access Code: [email or web address] URL: [email or web address] Date: 11/22/2022 Prepared by: Amada Kingfisher  Exercises - Putty Squeezes  - 1 x daily - 2 sets - 10 reps - Tip PUSH with Putty  - 1 x daily - 2 sets - 10 reps - Finger Extension with Putty  - 1 x daily - 2 sets - 10 reps - Seated Finger Extension with Putty  - 1 x daily - 2 sets - 10 reps - Finger Strengthening: Hook and [employer] in Putty  - 1 x daily - 2 sets - 10 reps - 3-Point Pinch with Putty  - 1 x daily - 2 sets - 10 reps - Seated Finger Composite Flexion Extension  - 2 x daily - 10 reps - Seated Claw Fist AROM  - 2 x daily - 10 reps - Seated Full Fist AROM  - 2 x daily - 10 reps - Seated Elbow Flexion with Self-Anchored Resistance  - 1 x daily - 2 sets - 10 reps - Standing Lat Pull Down with Resistance - Elbows Bent  - 1 x daily - 2 sets - 10 reps - Kettlebell Suitcase Carry  - 1 x daily - 2 sets - 10 reps - Seated Shoulder Flexion with Self-Anchored Resistance  - 1 x daily - 2 sets - 10 reps
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
## 2022-11-24 ENCOUNTER — Ambulatory Visit: Payer: Self-pay

## 2022-11-24 ENCOUNTER — Ambulatory Visit: Payer: Self-pay | Admitting: Occupational Therapy

## 2022-11-24 DIAGNOSIS — M5459 Other low back pain: Secondary | ICD-10-CM

## 2022-11-24 DIAGNOSIS — M79645 Pain in left finger(s): Secondary | ICD-10-CM

## 2022-11-24 DIAGNOSIS — M6281 Muscle weakness (generalized): Secondary | ICD-10-CM

## 2022-11-24 DIAGNOSIS — M25642 Stiffness of left hand, not elsewhere classified: Secondary | ICD-10-CM
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
## 2022-11-24 NOTE — Patient Instructions (Signed)
See Spanish version below:  Principles of Safe Lifting Assess the immediate area and load to be lifted. Bend the knees to lower the body to the level of the load. Keep feet shoulder width apart to ensure a broad, stable base. Keep the back straight (though not necessarily erect). Use a firm, palmar grip. Keep the arms close to trunk. Keep the load / weight close to the Centre of gravity and within the base of support. Point / pivot the feet in the direction of the movement. Never rotate the trunk while lifting. Lift using the strong muscles in the legs, rather than the postural muscles in the trunk. If the load is too heavy for one person, wait until you can get help.  Important Things To Remember Use mechanical means (e.g. hand trucks, pushcarts, etc.) when possible for heavier or awkward loads. Easier and safer to push than to pull. Keep loads as close to the body as possible and do not twist while lifting, carrying, or setting down a load. Nose, shoulders, hips, and toes should all be facing the same direction. Minimize reaching. Stoop or squat if the load is below you and use a ladder if it is too high. As a general rule, bend at the knees, not the hips. Get help when needed. Plan ahead for all parts of the lift: lifting, carrying, and setting down. Try to utilize proper handholds while lifting. If an item does not have a good handhold, think of ways to remedy this, such as placing the item in a container with good handholds, creating a safe and proper handhold with an appropriate tool, etc. Use personal protective equipment where needed, such as gloves with good grip and steel-toed boots where appropriate. Clear pathway before carrying, lifting, pushing or pulling. Implement rest breaks and job rotation for frequent and/or heavy lifting. Place items to be lifted within the "power zone". ie close to the body, between the mid-thigh and mid-chest of the person doing the lifting. This is  the area where the arms and back can lift the most with the least amount of effort.   Principios del levantamiento seguro  Evale el rea inmediata y la carga que se va a levantar.  Doble las rodillas para bajar el cuerpo al nivel de la carga.  Mantenga los pies separados a la altura de los hombros para asegurar una base amplia y [place].  Mantenga la espalda recta (aunque no necesariamente erguida).  Utilice un agarre palmar firme.  Mantenga los brazos cerca del tronco.  Mantenga la carga/peso cerca del centro de gravedad y dentro de la base de apoyo.  Apuntar/girar los pies en la direccin del movimiento. Nunca gire el tronco mientras lo levanta.  Levante utilizando los msculos fuertes de las piernas, Teacher, English as a foreign language de los msculos posturales del tronco.  Si la carga es demasiado pesada para una sola persona, espere hasta poder conseguir ayuda.  Cosas importantes para recordar  Utilice medios mecnicos (por ejemplo, carretillas, carritos de mano, etc.) cuando sea posible para cargas ms pesadas o incmodas.  Es ms fcil y Event organiser.  Mantenga las cargas lo ms cerca posible del cuerpo y no las gire [address], transporta o deposita una carga. La nariz, los hombros, las caderas y los dedos de los pies deben mirar en la misma direccin.  Minimizar el alcance.  Agchese o agchese si la carga est debajo de usted y use una escalera si es demasiado alta.  Como regla general, doble las rodillas,  no las caderas.  Obtenga ayuda cuando la necesite.  Planifique con anticipacin todas las partes del levantamiento: levantar, transportar y bajar.  Trate de Chemical engineer asideros [employer]. Si un artculo no tiene un buen asidero, piense en formas de remediarlo, Geophysicist/field seismologist en un recipiente con buenos asideros, crear un asidero seguro y adecuado con una herramienta adecuada, etc.  Utilice equipo de proteccin personal cuando sea necesario, como guantes con buen  agarre y botas con punta de acero cuando corresponda.  Despeje el camino antes de cargar, levantar, empujar o tirar.  Implementar descansos y rotacin de trabajo para levantamiento frecuente y/o pesado.  Coloque los elementos que se van a Lexicographer dentro de la "zona de potencia". es Designer, jewellery, cerca del cuerpo, entre la mitad del muslo y la mitad del pecho de la persona que Research scientist (life sciences). Esta es el rea donde los brazos y la espalda pueden levantarse ms con el menor esfuerzo.

## 2022-11-29 ENCOUNTER — Ambulatory Visit: Payer: Self-pay | Admitting: Occupational Therapy

## 2022-11-29 ENCOUNTER — Encounter (HOSPITAL_COMMUNITY): Payer: Self-pay | Admitting: Emergency Medicine

## 2022-11-29 DIAGNOSIS — B009 Herpesviral infection, unspecified: Secondary | ICD-10-CM | POA: Insufficient documentation

## 2022-11-29 DIAGNOSIS — M6281 Muscle weakness (generalized): Secondary | ICD-10-CM

## 2022-11-29 DIAGNOSIS — M79645 Pain in left finger(s): Secondary | ICD-10-CM

## 2022-11-29 DIAGNOSIS — R739 Hyperglycemia, unspecified: Secondary | ICD-10-CM | POA: Insufficient documentation

## 2022-11-29 DIAGNOSIS — B3742 Candidal balanitis: Secondary | ICD-10-CM | POA: Insufficient documentation

## 2022-11-29 DIAGNOSIS — M25642 Stiffness of left hand, not elsewhere classified: Secondary | ICD-10-CM

## 2022-11-29 NOTE — ED Triage Notes (Signed)
Pt presents POV with complains of infection on penis x 5 days. Denies any difficulty passing urine hurts on the outside of the penis. Also complains of a place on back of neck that started as a small bump on back of head that has gotten larger over the past 1.5 years and would like to know if it needs to be surgically removed.   Interpreter used [employer] [phone]
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
## 2022-11-29 NOTE — Discharge Planning (Signed)
Pt PCP will call him with appointment date and time.

## 2022-11-29 NOTE — Patient Instructions (Signed)
Updated Putty Exercises (same Access Code)  Access Code: [email or web address] URL: [email or web address] Date: 11/29/2022 Prepared by: Amada Kingfisher  Exercises  Reviewed: - Seated Finger MP Flexion with Putty  - 1 x daily - 10 reps Added: - Removing Marbles from Putty  - 1 x daily - 10 reps - Seated Claw Fist with Putty  - 1 x daily - 10 reps - Finger Adduction with Putty  - 1 x daily - 10 reps

## 2022-11-29 NOTE — Discharge Instructions (Addendum)
You appear to have diabetes based on your lab work. I am starting you on medication, but you will need to follow up with a doctor. The clinic will call you for an appointment.  Get help right away for any new or worsening infections

## 2022-11-30 ENCOUNTER — Ambulatory Visit: Payer: Self-pay | Admitting: Occupational Therapy

## 2022-11-30 DIAGNOSIS — M6281 Muscle weakness (generalized): Secondary | ICD-10-CM

## 2022-11-30 DIAGNOSIS — M25642 Stiffness of left hand, not elsewhere classified: Secondary | ICD-10-CM

## 2022-11-30 DIAGNOSIS — R278 Other lack of coordination: Secondary | ICD-10-CM
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
## 2022-12-06 ENCOUNTER — Ambulatory Visit: Payer: Self-pay | Admitting: Occupational Therapy

## 2022-12-06 ENCOUNTER — Telehealth: Payer: Self-pay | Admitting: Nurse Practitioner

## 2022-12-06 DIAGNOSIS — M25642 Stiffness of left hand, not elsewhere classified: Secondary | ICD-10-CM

## 2022-12-06 DIAGNOSIS — M6281 Muscle weakness (generalized): Secondary | ICD-10-CM

## 2022-12-06 DIAGNOSIS — R278 Other lack of coordination: Secondary | ICD-10-CM
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
## 2022-12-08 ENCOUNTER — Ambulatory Visit: Payer: Self-pay | Admitting: Occupational Therapy

## 2022-12-13 ENCOUNTER — Ambulatory Visit: Payer: Self-pay | Admitting: Occupational Therapy

## 2022-12-13 DIAGNOSIS — M79645 Pain in left finger(s): Secondary | ICD-10-CM

## 2022-12-13 DIAGNOSIS — M6281 Muscle weakness (generalized): Secondary | ICD-10-CM

## 2022-12-13 DIAGNOSIS — M25642 Stiffness of left hand, not elsewhere classified: Secondary | ICD-10-CM

## 2022-12-13 DIAGNOSIS — R278 Other lack of coordination: Secondary | ICD-10-CM
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
## 2022-12-14 ENCOUNTER — Ambulatory Visit: Payer: Self-pay | Admitting: Occupational Therapy

## 2022-12-21 ENCOUNTER — Ambulatory Visit: Payer: Self-pay | Admitting: Physician Assistant

## 2023-07-18 NOTE — Progress Notes (Unsigned)
 07/19/2023 Harry Thomas [id number] Jan 03, 1976  Referring provider: Claiborne Rigg, NP Primary GI doctor: Dr. Doy Hutching  ASSESSMENT AND PLAN:   Rectal pain with history of constipation x 3 years Rectal pain once a month or once a week with associated burning/itching, no hematochezia, AB pain Unremarkable rectal exam, most likely rectal fissure from constipation, due for colonoscopy will schedule at the [facility] for evaluation Recticare samples given, miralax for constipation We have discussed the risks of bleeding, infection, perforation, medication reactions, and remote risk of death associated with colonoscopy. All questions were answered and the patient acknowledges these risk and wishes to proceed.  Constipation x 2017 Not on medications - Increase fiber/ water intake, decrease caffeine, increase activity level. -Will add on Miralax daily  GERD with occ nausea No AB pain, no NSAIDS, no ETOH, no melena, no dysphagia Wife with history of H pylori Lifestyle changes discussed, avoid NSAIDS, ETOH, hand out given to the patient Will get Diatherix stool antigen for H. Pylori No significant symptoms for EGD   Patient Care Team: Claiborne Rigg, NP as PCP - General (Nurse Practitioner)  I have reviewed the clinic note as outlined by Quentin Mulling, PA and agree with the assessment, plan and medical decision making.  [name] presents with symptoms of rectal pain and previous history of constipation which is now improved.  Rectal exam shows anal fissure.  He is due for screening colonoscopy which can be scheduled at the [facility].  Agree with conservative measures for managing constipation including increasing fiber and water intake.  Harry Beach, MD   HISTORY OF PRESENT ILLNESS: 48 y.o. spanish speaking male with a past medical history of HLD, tobacco use and others listed below presents for evaluation of hemorrhoids.   Discussed the use of AI scribe software for clinical note  transcription with the patient, who gave verbal consent to proceed.  History of Present Illness   The patient, with a history of constipation and rectal pain, presents for a colonoscopy referral. He has never had a colonoscopy before and denies a family history of colon cancer. The rectal pain, described as a burning sensation and sometimes itching, occurs once a week or twice a month, particularly when he is constipated. He reports feeling bloated at times.  Three years ago, he experienced severe constipation, skipping bowel movements for several days. He took an over-the-counter treatment advertised on television, which he believes helped him. Currently, he is not taking any treatment for constipation but reports drinking water and staying active at work.  He denies blood in the stool but reports occasional nausea and acid reflux, describing it as the taste of food/acid coming back up, denies dysphagia, melena. He denies alcohol use for the past three years and does not take Aleve or ibuprofen. He reports no chest pain or shortness of breath and no difficulty swallowing.      He  reports that he has been smoking cigarettes. He has never used smokeless tobacco. He reports that he does not currently use alcohol. He reports that he does not currently use drugs.  RELEVANT GI HISTORY, LABS, IMAGING:  CBC    Component Value Date/Time   WBC 6.0 04/28/2020 1224   RBC 4.97 04/28/2020 1224   HGB 15.4 04/28/2020 1224   HCT 44.9 04/28/2020 1224   PLT 278 04/28/2020 1224   MCV 90.3 04/28/2020 1224   MCH 31.0 04/28/2020 1224   MCHC 34.3 04/28/2020 1224   RDW 11.8 04/28/2020 1224  No results for input(s): "HGB" in the last 8760 hours.  CMP     Component Value Date/Time   NA 139 04/28/2020 1224   K 3.8 04/28/2020 1224   CL 104 04/28/2020 1224   CO2 25 04/28/2020 1224   GLUCOSE 97 04/28/2020 1224   BUN 13 04/28/2020 1224   CREATININE 0.81 04/28/2020 1224   CALCIUM 9.1 04/28/2020 1224    GFRNONAA >60 04/28/2020 1224       No data to display            Current Medications:      Current Outpatient Medications (Analgesics):    acetaminophen (TYLENOL) 325 MG tablet, Take 2 tablets (650 mg total) by mouth every 6 (six) hours as needed. (Patient not taking: Reported on 07/19/2023)   ibuprofen (ADVIL) 800 MG tablet, Take 1 tablet (800 mg total) by mouth every 8 (eight) hours as needed for moderate pain. (Patient not taking: Reported on 07/19/2023)   naproxen (NAPROSYN) 500 MG tablet, Take 1 tablet (500 mg total) by mouth 2 (two) times daily. (Patient not taking: Reported on 07/19/2023)   oxyCODONE (ROXICODONE) 5 MG immediate release tablet, Take 1 tablet (5 mg total) by mouth every 4 (four) hours as needed for severe pain. (Patient not taking: Reported on 07/19/2023)   oxyCODONE (ROXICODONE) 5 MG immediate release tablet, Take 0.5-1 tablets (2.5-5 mg total) by mouth every 4 (four) hours as needed for severe pain. (Patient not taking: Reported on 07/19/2023)   Current Outpatient Medications (Other):    multivitamin (ONE-A-DAY MEN'S) TABS tablet, Take 1 tablet by mouth daily with breakfast.   diazepam (VALIUM) 5 MG tablet, Take 0.5-1 tablets (2.5-5 mg total) by mouth every 6 (six) hours as needed for muscle spasms. (Patient not taking: Reported on 07/19/2023)   lidocaine (LIDODERM) 5 %, Place 1 patch onto the skin daily as needed for up to 15 doses. Remove & Discard patch within 12 hours or as directed by MD (Patient not taking: Reported on 07/19/2023)   methocarbamol (ROBAXIN) 500 MG tablet, Take 1 tablet (500 mg total) by mouth 2 (two) times daily. (Patient not taking: Reported on 07/19/2023)  Medical History:  Past Medical History:  Diagnosis Date   Hyperlipidemia    Allergies:  Allergies  Allergen Reactions   Other Itching and Other (See Comments)    Chlorine- sneezing, also   Magnesium-Containing Compounds Rash     Surgical History:  He  has a past surgical history  that includes No past surgeries. Family History:  His family history includes Thyroid cancer in his sister.  REVIEW OF SYSTEMS  : All other systems reviewed and negative except where noted in the History of Present Illness.  PHYSICAL EXAM: BP 120/78 (BP Location: Left Arm, Patient Position: Sitting, Cuff Size: Normal)   Pulse 72   Ht 5' 7.32" (1.71 m)   Wt 155 lb 12.8 oz (70.7 kg)   BMI 24.17 kg/m  General Appearance: Well nourished, in no apparent distress. Head:   Normocephalic and atraumatic. Eyes:  sclerae anicteric,conjunctive pink  Respiratory: Respiratory effort normal, BS equal bilaterally without rales, rhonchi, wheezing. Cardio: RRR with no MRGs. Peripheral pulses intact.  Abdomen: Soft,  Obese ,active bowel sounds. mild tenderness in the epigastrium and in the RLQ. Without guarding and Without rebound. No masses. Rectal: Normal external rectal exam, normal rectal tone, appreciated internal hemorrhoids, non-tender, no masses, , brown stool, hemoccult not available during exam Musculoskeletal: Full ROM, Normal gait. Without edema. Skin:  Dry and intact  without significant lesions or rashes Neuro: Alert and  oriented x4;  No focal deficits. Psych:  Cooperative. Normal mood and affect.    Doree Albee, PA-C 9:17 AM

## 2023-07-19 ENCOUNTER — Encounter: Payer: Self-pay | Admitting: Physician Assistant

## 2023-07-19 ENCOUNTER — Other Ambulatory Visit: Payer: No Typology Code available for payment source

## 2023-07-19 ENCOUNTER — Ambulatory Visit (INDEPENDENT_AMBULATORY_CARE_PROVIDER_SITE_OTHER): Payer: No Typology Code available for payment source | Admitting: Physician Assistant

## 2023-07-19 VITALS — BP 120/78 | HR 72 | Ht 67.32 in | Wt 155.8 lb

## 2023-07-19 DIAGNOSIS — K6289 Other specified diseases of anus and rectum: Secondary | ICD-10-CM

## 2023-07-19 DIAGNOSIS — K219 Gastro-esophageal reflux disease without esophagitis: Secondary | ICD-10-CM | POA: Diagnosis not present

## 2023-07-19 DIAGNOSIS — K5909 Other constipation: Secondary | ICD-10-CM

## 2023-07-19 DIAGNOSIS — R110 Nausea: Secondary | ICD-10-CM

## 2023-07-19 DIAGNOSIS — K5904 Chronic idiopathic constipation: Secondary | ICD-10-CM

## 2023-07-19 NOTE — Patient Instructions (Addendum)
 Su proveedor le ha solicitado que vaya al stano para Education officer, environmental anlisis de laboratorio antes de irse hoy. Presione "B" en el ascensor. El laboratorio est ubicado en la primera puerta a la izquierda al salir del Medical sales representative. Deje su kit de H pylori en el laboratorio.  Miralax es un laxante osmtico.  Slo aporta ms agua a las heces.  Es seguro tomarlo a diario.  Puede tomar hasta 17 gramos de [employer] al da.  Mezclar con jugo o caf.  Comience con 1 tapn por la noche durante 3-4 das y reevale su respuesta en 3-4 das.  Puede aumentar y disminuir la dosis segn su respuesta.  Recuerde, pueden pasar entre 3 y 4 das hasta que surta efecto O hasta que los efectos desaparezcan.   A menudo lo combino con Benefit por la maana para ayudar a asegurar que las heces no estn demasiado sueltas.   Estreimiento, en adultos Constipation, Adult Estreimiento significa que una persona hace menos de tres deposiciones en una semana, tiene dificultades para defecar o las heces (deposiciones) son secas, duras o ms grandes de lo normal. La causa puede ser una afeccin subyacente. Puede empeorar con la edad si una persona toma ciertos medicamentos y no toma suficiente lquido. Siga estas instrucciones en su casa: Comida y bebida  Consuma alimentos con alto contenido de [place], como frijoles, cereales integrales, y frutas y verduras frescas. Limite el consumo de alimentos que sean bajos en fibra y ricos en grasas y azcares procesados, como los fritos y los dulces. Estos incluyen patatas fritas, hamburguesas, galletas, dulces y refrescos. Beba suficiente lquido como para Pharmacologist la orina de color amarillo plido. Instrucciones generales Haga actividad fsica habitualmente o como se lo haya indicado el mdico. Intente practicar 150 minutos de actividad fsica moderada por semana. Vaya al bao siempre que sienta ganas de defecar. No se aguante las ganas. Use los medicamentos de venta libre y los  recetados solamente como se lo haya indicado el mdico. Esto incluye suplementos de [place]. En el momento de hacer sus deposiciones: Respire profundamente mientras relaja la parte inferior del abdomen. Practique la relajacin del suelo plvico. Controle su afeccin para detectar cualquier cambio. Informe a su mdico acerca de cualquier cambio. Concurra a todas las visitas de [address] se lo haya indicado el mdico. Esto es importante. Comunquese con un mdico si: Su dolor empeora. Tiene fiebre. No defeca despus de 4 das. Vomita. Baja de [place] o no tiene apetito. Tiene sangrado por la abertura entre las nalgas (ano). Las heces son delgadas como un lpiz. Solicite ayuda de inmediato si: Lance Muss, y los sntomas empeoran repentinamente. Observa que se filtran heces o hay sangre en las heces. Tiene el abdomen distendido. Siente un dolor intenso en el abdomen. Se siente mareado o se desmaya. Resumen Estreimiento significa que una persona hace menos de tres deposiciones en una semana, tiene dificultades para defecar o las heces (deposiciones) son secas, duras o ms grandes de lo normal. Consuma alimentos con alto contenido de [place], como frijoles, cereales integrales, y frutas y verduras frescas. Beba suficiente lquido como para Pharmacologist la orina de color amarillo plido. Use los medicamentos de venta libre y los recetados solamente como se lo haya indicado el mdico. Esto incluye suplementos de [place]. Esta informacin no tiene Theme park manager el consejo del mdico. Asegrese de hacerle al mdico cualquier pregunta que tenga. Document Revised: 06/15/2019 Document Reviewed: 03/24/2022 Elsevier Patient Education  2024 [employer].   Due to recent changes in  healthcare laws, you may see the results of your imaging and laboratory studies on MyChart before your provider has had a chance to review them.  We understand that in some cases there may be results that are confusing or  concerning to you. Not all laboratory results come back in the same time frame and the provider may be waiting for multiple results in order to interpret others.  Please give [place] 48 hours in order for your provider to thoroughly review all the results before contacting the office for clarification of your results.

## 2023-08-18 ENCOUNTER — Encounter: Payer: Self-pay | Admitting: Pediatrics

## 2023-08-25 NOTE — Progress Notes (Unsigned)
 [facility] History and Physical   Primary Care Physician:  Claiborne Rigg, NP   Reason for Procedure:  Colon cancer screening, incidental symptoms of rectal pain and constipation  Plan:    Colonoscopy   HPI: Harry Thomas is a 48 y.o. male undergoing colonoscopy for colon cancer screening and incidental symptoms of rectal pain and constipation.  No prior history of colonoscopy.  He was seen in the office in February 2025 at which time he endorsed rectal pain described as burning and itching.  Suspected that he may have had an anal fissure.  No family history of colorectal cancer or polyps.   Past Medical History:  Diagnosis Date   Hyperlipidemia     Past Surgical History:  Procedure Laterality Date   NO PAST SURGERIES      Prior to Admission medications   Medication Sig Start Date End Date Taking? Authorizing Provider  acetaminophen (TYLENOL) 325 MG tablet Take 2 tablets (650 mg total) by mouth every 6 (six) hours as needed. Patient not taking: Reported on 07/19/2023 06/07/21   Tanda Rockers A, DO  diazepam (VALIUM) 5 MG tablet Take 0.5-1 tablets (2.5-5 mg total) by mouth every 6 (six) hours as needed for muscle spasms. Patient not taking: Reported on 07/19/2023 09/05/22   Arthor Captain, PA-C  ibuprofen (ADVIL) 800 MG tablet Take 1 tablet (800 mg total) by mouth every 8 (eight) hours as needed for moderate pain. Patient not taking: Reported on 07/19/2023 09/02/22   Bethann Berkshire, MD  lidocaine (LIDODERM) 5 % Place 1 patch onto the skin daily as needed for up to 15 doses. Remove & Discard patch within 12 hours or as directed by MD Patient not taking: Reported on 07/19/2023 06/07/21   Sloan Leiter, DO  methocarbamol (ROBAXIN) 500 MG tablet Take 1 tablet (500 mg total) by mouth 2 (two) times daily. Patient not taking: Reported on 07/19/2023 06/09/22   Gailen Shelter, PA  multivitamin (ONE-A-DAY MEN'S) TABS tablet Take 1 tablet by mouth daily with breakfast.     [provider]  naproxen (NAPROSYN) 500 MG tablet Take 1 tablet (500 mg total) by mouth 2 (two) times daily. Patient not taking: Reported on 07/19/2023 09/29/20   Theron Arista, PA-C  oxyCODONE (ROXICODONE) 5 MG immediate release tablet Take 1 tablet (5 mg total) by mouth every 4 (four) hours as needed for severe pain. Patient not taking: Reported on 07/19/2023 06/07/21   Tanda Rockers A, DO  oxyCODONE (ROXICODONE) 5 MG immediate release tablet Take 0.5-1 tablets (2.5-5 mg total) by mouth every 4 (four) hours as needed for severe pain. Patient not taking: Reported on 07/19/2023 09/05/22   Arthor Captain, PA-C    Current Outpatient Medications  Medication Sig Dispense Refill   acetaminophen (TYLENOL) 325 MG tablet Take 2 tablets (650 mg total) by mouth every 6 (six) hours as needed. (Patient not taking: Reported on 11/02/2022) 36 tablet 0   diazepam (VALIUM) 5 MG tablet Take 0.5-1 tablets (2.5-5 mg total) by mouth every 6 (six) hours as needed for muscle spasms. (Patient not taking: Reported on 08/26/2023) 16 tablet 0   ibuprofen (ADVIL) 800 MG tablet Take 1 tablet (800 mg total) by mouth every 8 (eight) hours as needed for moderate pain. (Patient not taking: Reported on 08/26/2023) 21 tablet 0   lidocaine (LIDODERM) 5 % Place 1 patch onto the skin daily as needed for up to 15 doses. Remove & Discard patch within 12 hours or as directed by MD (Patient  not taking: Reported on 08/26/2023) 15 patch 0   methocarbamol (ROBAXIN) 500 MG tablet Take 1 tablet (500 mg total) by mouth 2 (two) times daily. (Patient not taking: Reported on 11/02/2022) 20 tablet 0   multivitamin (ONE-A-DAY MEN'S) TABS tablet Take 1 tablet by mouth daily with breakfast. (Patient not taking: Reported on 08/26/2023)     naproxen (NAPROSYN) 500 MG tablet Take 1 tablet (500 mg total) by mouth 2 (two) times daily. (Patient not taking: Reported on 11/02/2022) 30 tablet 0   oxyCODONE (ROXICODONE) 5 MG immediate release tablet Take 1 tablet (5 mg  total) by mouth every 4 (four) hours as needed for severe pain. (Patient not taking: Reported on 11/02/2022) 8 tablet 0   oxyCODONE (ROXICODONE) 5 MG immediate release tablet Take 0.5-1 tablets (2.5-5 mg total) by mouth every 4 (four) hours as needed for severe pain. (Patient not taking: Reported on 11/02/2022) 16 tablet 0   Current Facility-Administered Medications  Medication Dose Route Frequency Provider Last Rate Last Admin   0.9 %  sodium chloride infusion  500 mL Intravenous Once Hawa Henly, Durene Romans, MD        Allergies as of 08/26/2023 - Review Complete 08/26/2023  Allergen Reaction Noted   Magnesium-containing compounds Rash 07/19/2023   Other Itching and Other (See Comments) 09/29/2020    Family History  Problem Relation Age of Onset   Thyroid cancer Sister    Colon cancer Neg Hx    Esophageal cancer Neg Hx    Rectal cancer Neg Hx    Stomach cancer Neg Hx     Social History   Socioeconomic History   Marital status: Married    Spouse name: Not on file   Number of children: 4   Years of education: Not on file   Highest education level: Not on file  Occupational History   Occupation: Public affairs consultant  Tobacco Use   Smoking status: Some Days    Types: Cigarettes   Smokeless tobacco: Never   Tobacco comments:    1-2 cigarettes EOD  Substance and Sexual Activity   Alcohol use: Not Currently   Drug use: Not Currently   Sexual activity: Not on file  Other Topics Concern   Not on file  Social History Narrative   Not on file   Social Drivers of Health   Financial Resource Strain: Not on file  Food Insecurity: Not on file  Transportation Needs: Not on file  Physical Activity: Not on file  Stress: Not on file  Social Connections: Unknown (10/06/2021)   [facility]   Social Network    Social Network: Not on file  Intimate Partner Violence: Unknown (08/28/2021)   Received from [facility]   HITS    Physically Hurt: Not on file     Insult or Talk Down To: Not on file    Threaten Physical Harm: Not on file    Scream or Curse: Not on file    Review of Systems:  All other review of systems negative except as mentioned in the HPI.  Physical Exam: Vital signs BP 112/73   Pulse 74   Temp 98.4 F (36.9 C) (Temporal)   Resp 14   Ht 5\' 7"  (1.702 m)   Wt 155 lb (70.3 kg)   SpO2 98%   BMI 24.28 kg/m   General:   Alert,  Well-developed, well-nourished, pleasant and cooperative in NAD Airway:  Mallampati 2 Lungs:  Clear throughout to auscultation.   Heart:  Regular rate and rhythm; no murmurs, clicks, rubs,  or gallops. Abdomen:  Soft, nontender and nondistended. Normal bowel sounds.   Neuro/Psych:  Normal mood and affect. A and O x 3  Maren Beach, MD [facility],

## 2023-08-26 ENCOUNTER — Encounter: Payer: Self-pay | Admitting: Pediatrics

## 2023-08-26 ENCOUNTER — Ambulatory Visit: Payer: No Typology Code available for payment source | Admitting: Pediatrics

## 2023-08-26 VITALS — BP 102/61 | HR 65 | Temp 98.4°F | Resp 15 | Ht 67.0 in | Wt 155.0 lb

## 2023-08-26 DIAGNOSIS — Z1211 Encounter for screening for malignant neoplasm of colon: Secondary | ICD-10-CM

## 2023-08-26 DIAGNOSIS — K5900 Constipation, unspecified: Secondary | ICD-10-CM

## 2023-08-26 DIAGNOSIS — D124 Benign neoplasm of descending colon: Secondary | ICD-10-CM | POA: Diagnosis not present

## 2023-08-26 DIAGNOSIS — D122 Benign neoplasm of ascending colon: Secondary | ICD-10-CM

## 2023-08-26 DIAGNOSIS — K648 Other hemorrhoids: Secondary | ICD-10-CM | POA: Diagnosis not present

## 2023-08-26 DIAGNOSIS — K6289 Other specified diseases of anus and rectum: Secondary | ICD-10-CM | POA: Diagnosis not present

## 2023-08-26 DIAGNOSIS — D123 Benign neoplasm of transverse colon: Secondary | ICD-10-CM | POA: Diagnosis not present

## 2023-08-26 DIAGNOSIS — K5904 Chronic idiopathic constipation: Secondary | ICD-10-CM

## 2023-08-26 MED ORDER — SODIUM CHLORIDE 0.9 % IV SOLN: 500.0000 mL | Freq: Once | INTRAVENOUS | Status: DC

## 2023-08-26 NOTE — Op Note (Signed)
 [facility] Patient Name: Harry Thomas Procedure Date: 08/26/2023 9:46 AM MRN: [id number] Endoscopist: Maren Beach , MD, [id number] Age: 48 Referring MD:  Date of Birth: Aug 01, 1975 Gender: Male Account #: [id number] Procedure:                Colonoscopy Indications:              Screening for colorectal malignant neoplasm, This                            is the patient's first colonoscopy, Incidental                            symptoms of constipation and anorectal discomfort Procedure:                Pre-Anesthesia Assessment:                           - Prior to the procedure, a History and Physical                            was performed, and patient medications and                            allergies were reviewed. The patient's tolerance of                            previous anesthesia was also reviewed. The risks                            and benefits of the procedure and the sedation                            options and risks were discussed with the patient.                            All questions were answered, and informed consent                            was obtained. Prior Anticoagulants: The patient has                            taken no anticoagulant or antiplatelet agents. ASA                            Grade Assessment: II - A patient with mild systemic                            disease. After reviewing the risks and benefits,                            the patient was deemed in satisfactory condition to                            undergo the procedure.  After obtaining informed consent, the colonoscope                            was passed under direct vision. Throughout the                            procedure, the patient's blood pressure, pulse, and                            oxygen saturations were monitored continuously. The                            CF [id number] #[id number] was introduced through the anus                             and advanced to the terminal ileum. The colonoscopy                            was performed without difficulty. The patient                            tolerated the procedure well. The quality of the                            bowel preparation was good. The terminal ileum,                            ileocecal valve, appendiceal orifice, and rectum                            were photographed. Scope In: 10:01:22 AM Scope Out: 10:17:35 AM Scope Withdrawal Time: 0 hours 13 minutes 35 seconds  Total Procedure Duration: 0 hours 16 minutes 13 seconds  Findings:                 The perianal and digital rectal examinations were                            normal. Pertinent negatives include normal                            sphincter tone and no palpable rectal lesions.                           Three sessile polyps were found in the descending                            colon, transverse colon and ascending colon. The                            polyps were 5 to 6 mm in size. These polyps were                            removed with a cold snare. Resection and  retrieval                            were complete.                           The terminal ileum appeared normal.                           Internal hemorrhoids were found during retroflexion. Complications:            No immediate complications. Estimated blood loss:                            Minimal. Estimated Blood Loss:     Estimated blood loss was minimal. Impression:               - Three 5 to 6 mm polyps in the descending colon,                            in the transverse colon and in the ascending colon,                            removed with a cold snare. Resected and retrieved.                           - The examined portion of the ileum was normal.                           - Internal hemorrhoids. Recommendation:           - Discharge patient to home.                           - Await pathology results.                            - Repeat colonoscopy for surveillance based on                            pathology results.                           - The findings and recommendations were discussed                            with the patient's family.                           - Return to GI clinic in 2-3 months.                           - Patient has a contact number available for                            emergencies. The signs and symptoms of potential  delayed complications were discussed with the                            patient. Return to normal activities tomorrow.                            Written discharge instructions were provided to the                            patient. Maren Beach, MD 08/26/2023 10:22:37 AM This report has been signed electronically.

## 2023-08-26 NOTE — Patient Instructions (Addendum)
 Se proporcion material educativo al [employer] hemorroides y plipos.  Reanudar la dieta anterior.  Continuar con Tourist information centre manager actual.  Esperando los [place] de patologa.  SIGNATURES/CONFIDENTIALITY: You and/or your care partner have signed paperwork which will be entered into your electronic medical record.  These signatures attest to the fact that that the information above on your After Visit Summary has been reviewed and is understood.  Full responsibility of the confidentiality of this discharge information lies with you and/or your care-partner.USTED TUVO UN PROCEDIMIENTO ENDOSCPICO HOY EN [facility]:   Lea el informe del procedimiento que se le entreg para cualquier pregunta especfica sobre lo que se Dentist.  Si el informe del examen no responde a sus preguntas, por favor llame a su gastroenterlogo para aclararlo.  Si usted solicit que no se le den [employer] de lo que se Clinical cytogeneticist en su procedimiento al [employer] va a cuidar, entonces el informe del procedimiento se ha incluido en un sobre sellado para que usted lo revise despus cuando le sea ms conveniente.   LO QUE PUEDE ESPERAR: Algunas sensaciones de hinchazn en el abdomen.  Puede tener ms gases de lo normal.  El caminar puede ayudarle a eliminar el aire que se le puso en el tracto gastrointestinal durante el procedimiento y reducir la hinchazn.  Si le hicieron una endoscopia inferior (como una colonoscopia o una sigmoidoscopia flexible), podra notar manchas de sangre en las heces fecales o en el papel higinico.  Si se someti a una preparacin intestinal para su procedimiento, es posible que no tenga una evacuacin intestinal normal durante [employer].   Tenga en cuenta:  Es posible que note un poco de irritacin y congestin en la nariz o algn drenaje.  Esto es debido al oxgeno [employer] durante su procedimiento.  No hay que preocuparse y esto debe desaparecer ms o Office manager.   SNTOMAS PARA REPORTAR INMEDIATAMENTE:  Despus de una endoscopia inferior (colonoscopia o sigmoidoscopia flexible):  Cantidades excesivas de sangre en las heces fecales  Sensibilidad significativa o empeoramiento de los dolores abdominales   Hinchazn aguda del abdomen que antes no tena   Fiebre de 100F o ms   Para asuntos urgentes o de Associate Professor, puede comunicarse con un gastroenterlogo a cualquier hora llamando al [phone].  DIETA:  Recomendamos una comida pequea al principio, pero luego puede continuar con su dieta normal.  Tome muchos lquidos, Tax adviser las bebidas alcohlicas durante 24 horas.    ACTIVIDAD:  Debe planear tomarse las cosas con calma por el resto del da y no debe CONDUCIR ni usar maquinaria pesada Patent examiner (debido a los medicamentos de sedacin utilizados durante el examen).     SEGUIMIENTO: Nuestro personal llamar al nmero que aparece en su historial al siguiente da hbil de su procedimiento para ver cmo se siente y para responder cualquier pregunta o inquietud que pueda tener con respecto a la informacin que se le dio despus del procedimiento. Si no podemos contactarle, le dejaremos un mensaje.  Sin embargo, si se siente bien y no tiene English as a second language teacher, no es necesario que nos devuelva la llamada.  Asumiremos que ha regresado a sus actividades diarias normales sin incidentes. Si se le tomaron algunas biopsias, le contactaremos por telfono o por carta en las prximas 3 semanas.  Si no ha sabido [employer] biopsias en el transcurso de 3 semanas, por favor llmenos al [phone].   FIRMAS/CONFIDENCIALIDAD: Daphane Shepherd y/o  el acompaante que le cuide han firmado documentos que se ingresarn en su historial mdico electrnico.  Estas firmas atestiguan el hecho de que la informacin anterior

## 2023-08-26 NOTE — Progress Notes (Signed)
 Pt's states no medical or surgical changes since previsit or office visit.   Interpreter used today at the [facility] for this pt.  Interpreter's name is- Jacquenette Shone

## 2023-08-26 NOTE — Progress Notes (Signed)
 Called to room to assist during endoscopic procedure.  Patient ID and intended procedure confirmed with present staff. Received instructions for my participation in the procedure from the performing physician.

## 2023-08-26 NOTE — Progress Notes (Signed)
 Report to PACU, RN, vss, BBS= Clear.

## 2023-08-29 ENCOUNTER — Telehealth: Payer: Self-pay | Admitting: Physician Assistant

## 2023-08-29 ENCOUNTER — Telehealth: Payer: Self-pay

## 2023-08-29 NOTE — Telephone Encounter (Signed)
 Please see note below and advise if anything else is recommended at this time.

## 2023-08-29 NOTE — Telephone Encounter (Signed)
 Called and spoke to patient, advised him of results, patient did not have any further questions.   Thank you.

## 2023-08-29 NOTE — Telephone Encounter (Signed)
  Follow up Call-     08/26/2023    9:15 AM  Call back number  Post procedure Call Back phone  # [phone]  Permission to leave phone message Yes     Patient questions:  Do you have a fever, pain , or abdominal swelling? No. Pain Score  0 *  Have you tolerated food without any problems? Yes.    Have you been able to return to your normal activities? Yes.    Do you have any questions about your discharge instructions: Diet   No. Medications  No. Follow up visit  No.  Do you have questions or concerns about your Care? No.  Actions: * If pain score is 4 or above: No action needed, pain <4.

## 2023-08-29 NOTE — Telephone Encounter (Signed)
 Negative H. pylori stool, continue GERD lifestyle changes discussed, call if any worsening symptoms.

## 2023-08-29 NOTE — Telephone Encounter (Signed)
 Inbound call from patient, states he has not received the results for the h pylori results. Test done on 2/25 Would like to discuss results with a nurse.

## 2023-08-30 ENCOUNTER — Encounter: Payer: Self-pay | Admitting: Pediatrics

## 2023-08-30 LAB — SURGICAL PATHOLOGY

## 2023-11-16 DIAGNOSIS — K5900 Constipation, unspecified: Secondary | ICD-10-CM | POA: Insufficient documentation

## 2023-11-16 IMAGING — CR DG CHEST 2V
2 series · 2 of 2 positions shown · non-contrast
Comparison: 04/28/2020

CLINICAL DATA: Cough, chest pain, shortness of breath

EXAM:
CHEST - 2 VIEW

[w chest pa]
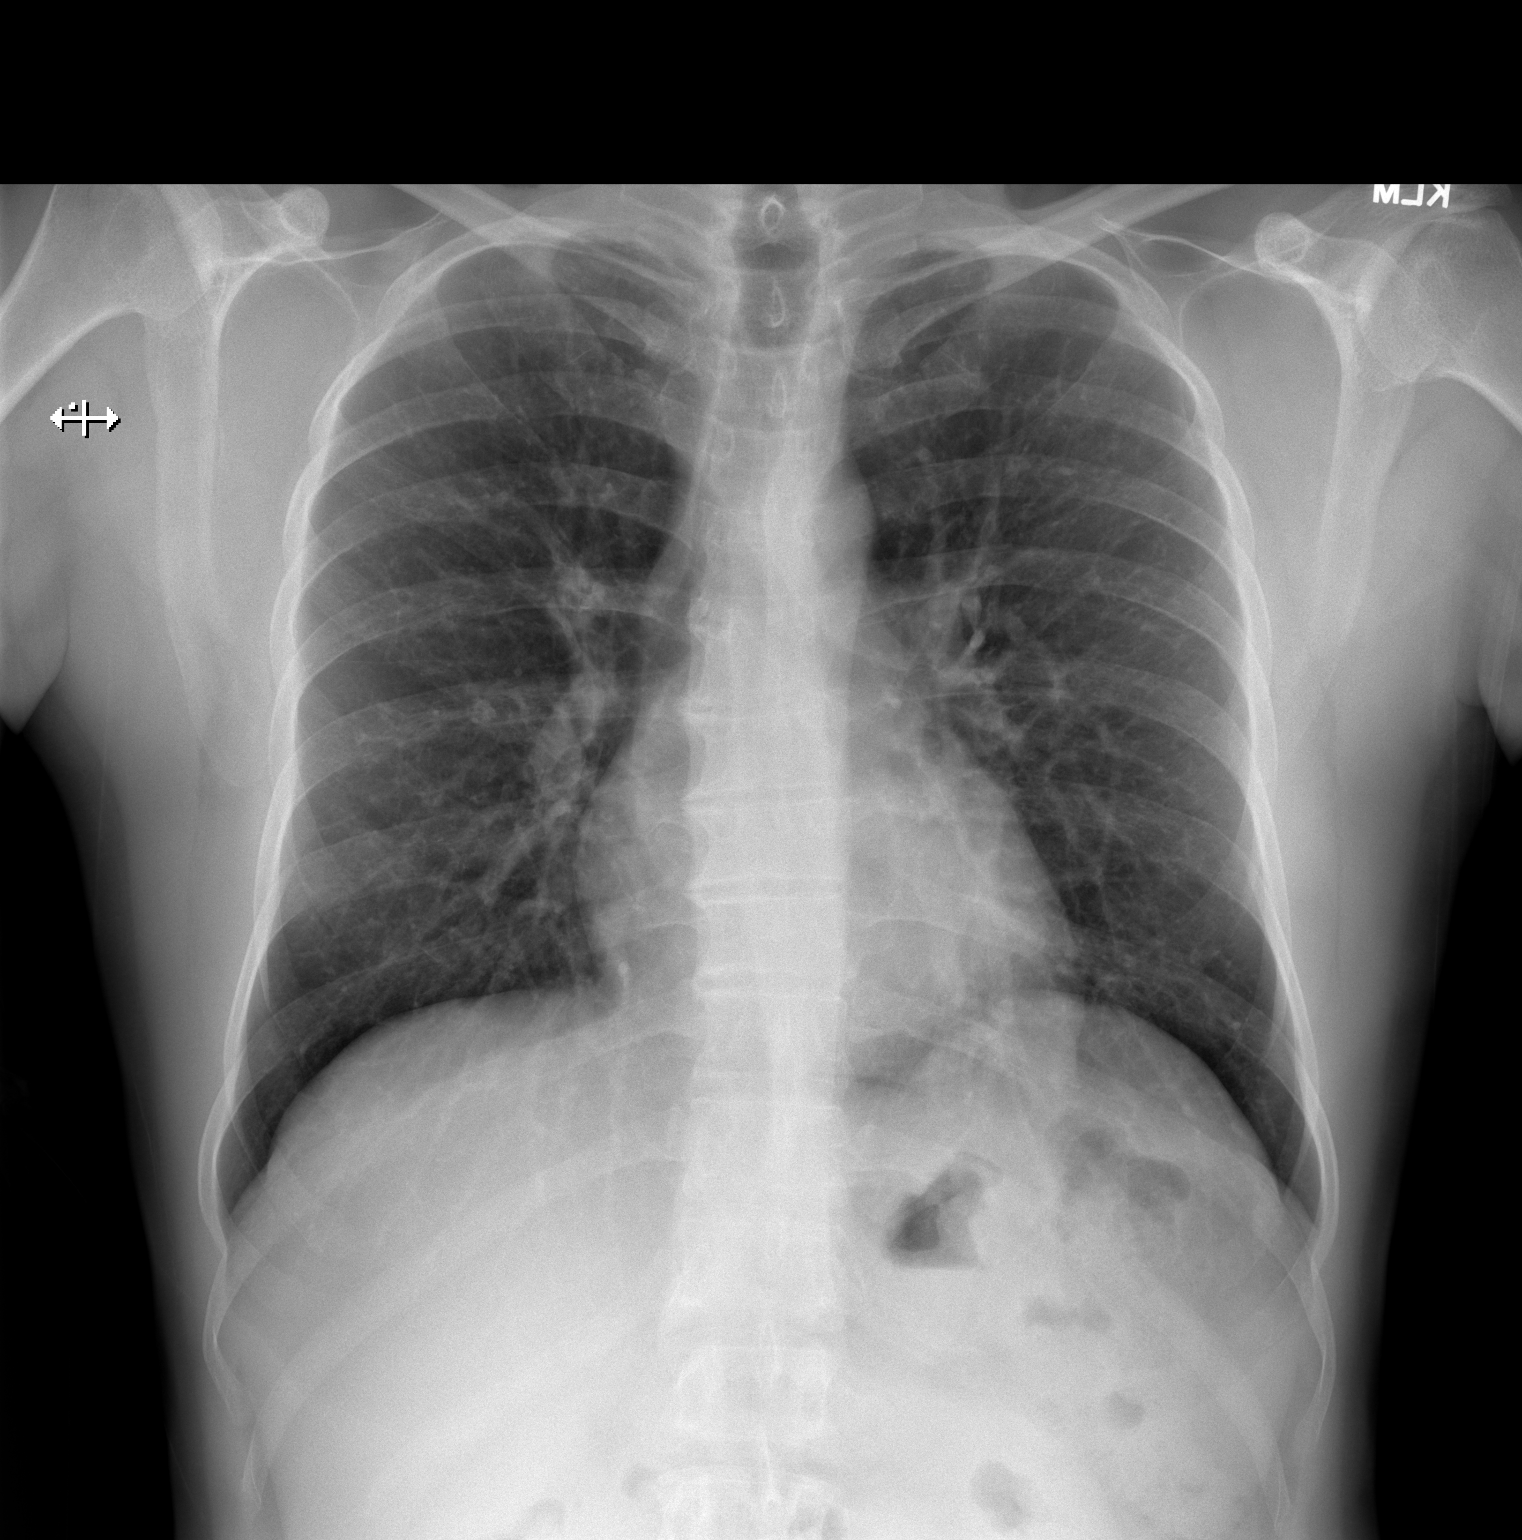

[w chest lat]
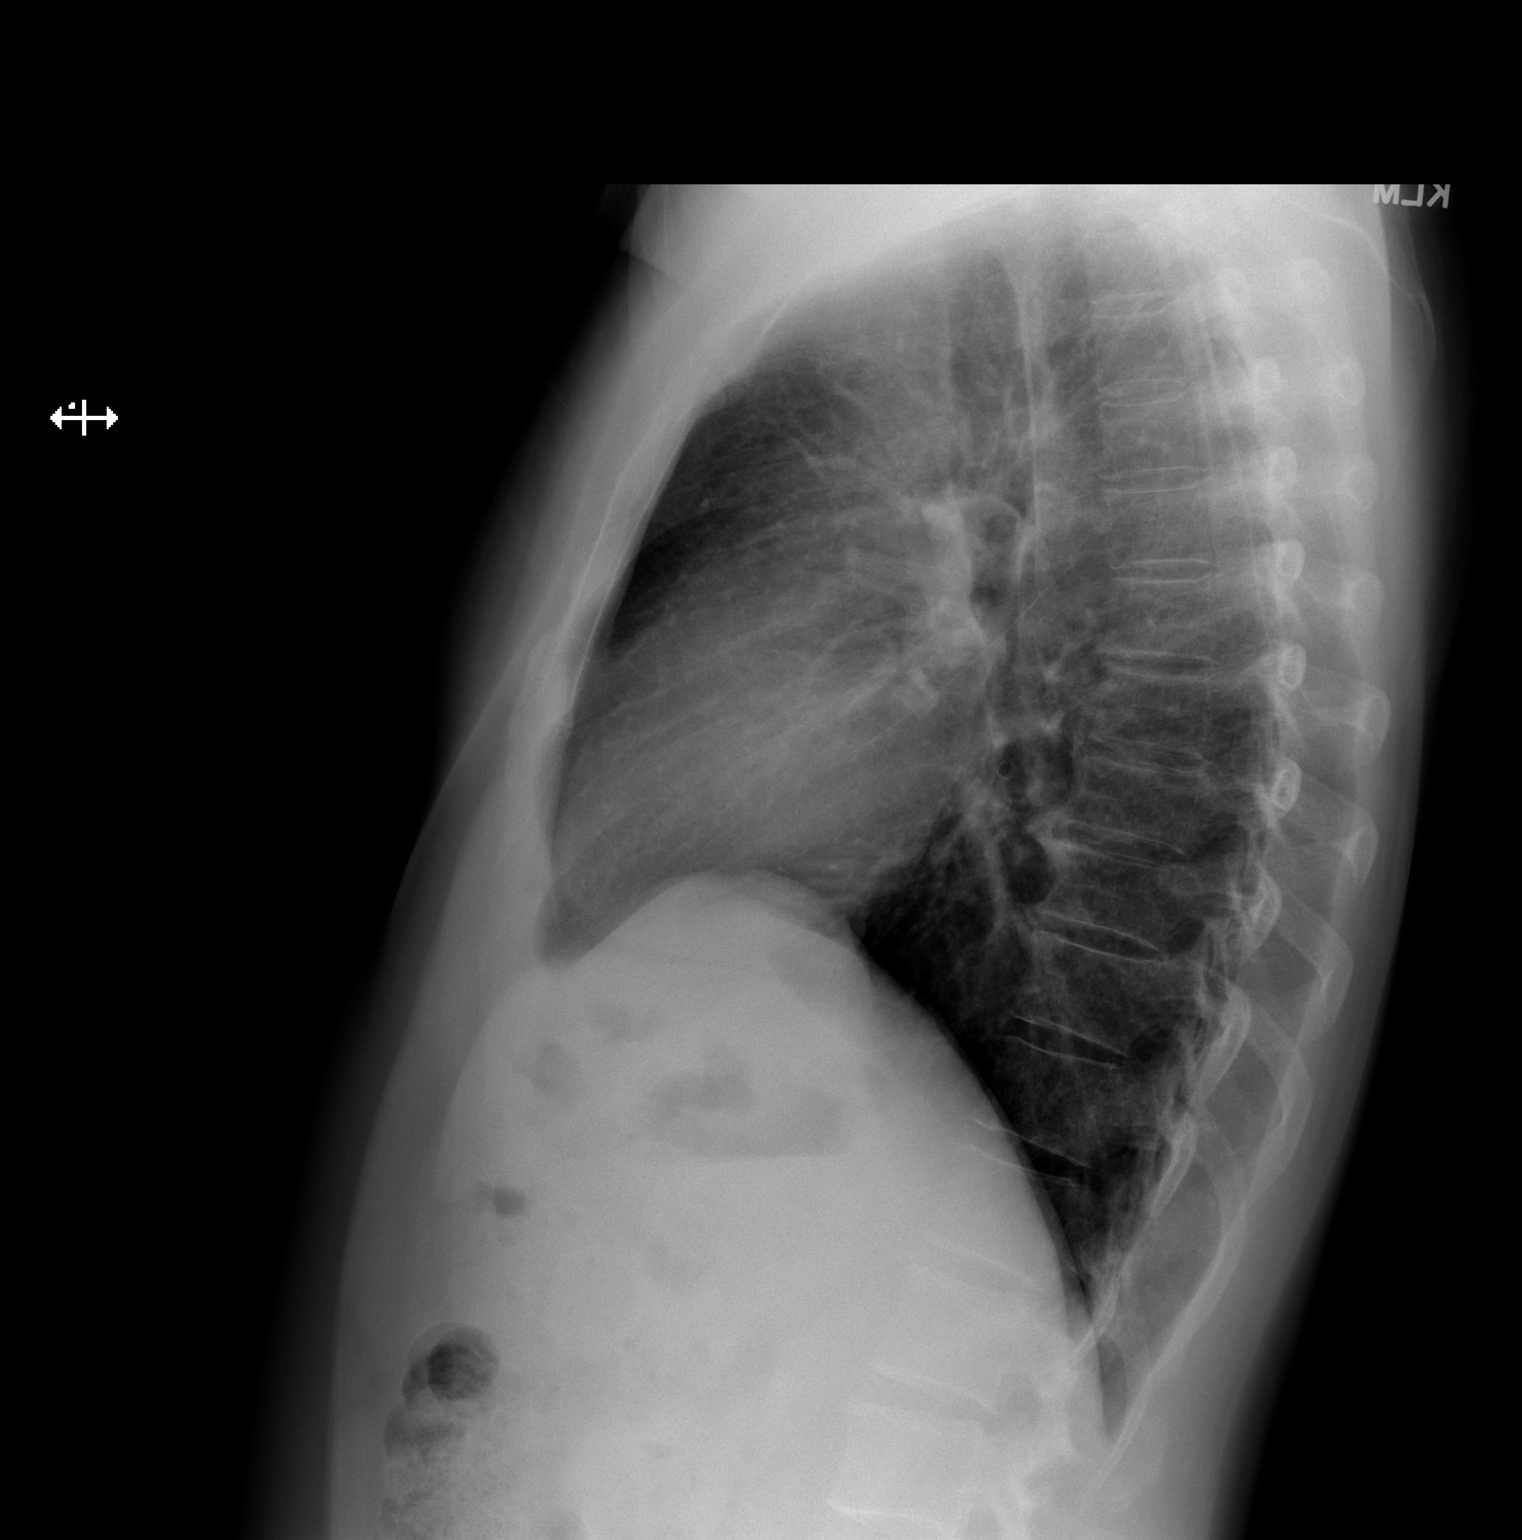

[2 of 2 positions shown; findings below may reference images not displayed]

FINDINGS: The heart size and mediastinal contours are within normal limits.
Both lungs are clear. The visualized skeletal structures are
unremarkable.
IMPRESSION: No active cardiopulmonary disease.

## 2023-11-16 NOTE — Progress Notes (Signed)
 [facility] Return Visit   Referring Provider Theotis Haze ORN, NP [address] [place],  [place] [address]  Primary Care Provider Theotis Haze ORN, NP  Patient Profile: Harry Thomas is a 48 y.o. male who returns to the [facility] for follow-up of the problem(s) noted below.  Problem List: Constipation Rectal pain - suspected anal fissure GERD Family history of H. pylori infection. History of adenomatous colon polyps   History of Present Illness   Harry Thomas was last seen in the GI office 07/19/2023 by Alan Coombs, PA   Current GI Meds  MiraLAX as needed RectiCare as needed  Interval History   Constipation, rectal pain -- At last visit reported intermittent symptoms of constipation and rectal pain -- Advised to trial MiraLAX and RectiCare -- Since last visit underwent colonoscopy for 08/2023 -  three 5-6 mm polyps (TA) , IH -3-year follow-up exam recommended  -- At today's visit, notes that he is feeling well without significant symptoms of constipation -- Not using MiraLAX on a regular basis -- Modified diet due to having hyperlipidemia and this may have ameliorated his bowel movements -- No longer experiencing rectal pain but he is aware that he can use RectiCare if needed  GERD -- History of nausea and intermittent acid reflux described as material being regurgitated into throat -- No red flag symptoms at time of last visit or an EGD -- Reported a history of his wife having H. Pylori and [employer] stool antigen testing for H. pylori was ordered -negative -- No active GERD symptoms today  History of adenomatous colon polyps -- As below, last colonoscopy disclosed three 5 to 6 mm tubular adenomas -- No family history of colorectal cancer or polyps -- Reviewed that he is due in 3 years for his next colonoscopy  Last colonoscopy: 08/2023 - three 5-6 mm polyps (TA) , IH -3-year follow-up exam recommended Last  endoscopy: None  Last Abd CT/CTE/MRE:  CT pelvis 08/2022 -no fracture or dislocation; bowel loops  GI Review of Symptoms Significant for None. Otherwise negative.  General Review of Systems  Review of systems is significant for the pertinent positives and negatives as listed per the HPI.  Full ROS is otherwise negative.  Past Medical History   Past Medical History:  Diagnosis Date   Hyperlipidemia      Past Surgical History   Past Surgical History:  Procedure Laterality Date   NO PAST SURGERIES       Allergies and Medications   Allergies  Allergen Reactions   Magnesium-Containing Compounds Rash   Other Itching and Other (See Comments)    Chlorine- sneezing, also   Current Meds  Medication Sig   acetaminophen  (TYLENOL ) 325 MG tablet Take 2 tablets (650 mg total) by mouth every 6 (six) hours as needed.   diazepam  (VALIUM ) 5 MG tablet Take 0.5-1 tablets (2.5-5 mg total) by mouth every 6 (six) hours as needed for muscle spasms.   ibuprofen  (ADVIL ) 800 MG tablet Take 1 tablet (800 mg total) by mouth every 8 (eight) hours as needed for moderate pain.   lidocaine  (LIDODERM ) 5 % Place 1 patch onto the skin daily as needed for up to 15 doses. Remove & Discard patch within 12 hours or as directed by MD   methocarbamol  (ROBAXIN ) 500 MG tablet Take 1 tablet (500 mg total) by mouth 2 (two) times daily.   multivitamin (ONE-A-DAY MEN'S) TABS tablet Take 1 tablet by mouth daily with breakfast.  naproxen  (NAPROSYN ) 500 MG tablet Take 1 tablet (500 mg total) by mouth 2 (two) times daily.   omega-3 acid ethyl esters (LOVAZA) 1 g capsule Take 2 capsules by mouth 2 (two) times daily.   oxyCODONE  (ROXICODONE ) 5 MG immediate release tablet Take 1 tablet (5 mg total) by mouth every 4 (four) hours as needed for severe pain.   oxyCODONE  (ROXICODONE ) 5 MG immediate release tablet Take 0.5-1 tablets (2.5-5 mg total) by mouth every 4 (four) hours as needed for severe pain.     Family His    Family History  Problem Relation Age of Onset   Thyroid cancer Sister    Colon cancer Neg Hx    Esophageal cancer Neg Hx    Rectal cancer Neg Hx    Stomach cancer Neg Hx     Social History   Social History   Tobacco Use   Smoking status: Some Days    Types: Cigarettes   Smokeless tobacco: Never   Tobacco comments:    1-2 cigarettes EOD  Substance Use Topics   Alcohol use: Not Currently   Drug use: Not Currently   Harry Thomas reports that he has been smoking cigarettes. He has never used smokeless tobacco. He reports that he does not currently use alcohol. He reports that he does not currently use drugs.  Vital Signs and Physical Examination   Vitals:   11/18/23 1328  BP: 110/68  Pulse: 64   Body mass index is 24.18 kg/m. Weight: 154 lb 6 oz (70 kg)  General: Well developed, well nourished, no acute distress Head: Normocephalic and atraumatic Eyes: Sclerae anicteric, EOMI Lungs: Clear throughout to auscultation Heart: Regular rate and rhythm; No murmurs, rubs or bruits Abdomen: Soft, non tender and non distended. No masses, hepatosplenomegaly or hernias noted. Normal Bowel sounds Rectal: Deferred Musculoskeletal: Symmetrical with no gross deformities     Review of Data   The following data was reviewed at the time of this encounter:   Laboratory Studies      Latest Ref Rng & Units 04/28/2020   12:24 PM  CBC  WBC 4.0 - 10.5 K/uL 6.0   Hemoglobin 13.0 - 17.0 g/dL 84.5   Hematocrit 60.9 - 52.0 % 44.9   Platelets 150 - 400 K/uL 278     No results found for: LIPASE    Latest Ref Rng & Units 04/28/2020   12:24 PM  CMP  Glucose 70 - 99 mg/dL 97   BUN 6 - 20 mg/dL 13   Creatinine 9.38 - 1.24 mg/dL 9.18   Sodium 864 - 854 mmol/L 139   Potassium 3.5 - 5.1 mmol/L 3.8   Chloride 98 - 111 mmol/L 104   CO2 22 - 32 mmol/L 25   Calcium 8.9 - 10.3 mg/dL 9.1    [employer] H. pylori stool test 06/2023 negative  Imaging Studies  CT pelvis 09/05/2023 No acute  fracture or dislocation; normal bowel loops  CTAP 05/2021 1. Subcutaneous edema midline lower back, consistent with history of trauma. No fluid collection or evidence of hematoma. 2. No acute displaced fracture. 3. Otherwise unremarkable unenhanced CT of the abdomen and pelvis.  GI Procedures and Studies  Colonoscopy 08/2023 Three 5-6 mm polyps (TA) , IH -3-year follow-up exam recommended  Clinical Impression  It is my clinical impression that Harry Thomas is a 48 y.o. male with;  Constipation Rectal pain - suspected anal fissure GERD Family history of H. pylori infection History of adenomatous colon polyps  Harry Thomas returns to  the office today for follow-up of constipation, rectal pain, GERD, family history of H. pylori and adenomatous colon polyps.  At today's visit we reviewed that his colonoscopy disclosed 3 subcentimeter tubular adenomas and will be due for a 3-year follow-up colonoscopy.  There were no pertinent findings on his colonoscopy to explain his symptoms of constipation.  His constipation has improved with dietary modification.  He is aware that he can use MiraLAX as needed.  Similarly, his rectal pain has resolved.  This was felt to likely be due to an anal fissure.  He may use RectiCare as needed.  GERD symptoms are currently stable.  At last visit it was noted that his wife had a history of H. pylori infection.  [employer] H. pylori stool testing performed in February 2025 was negative.  Plan  Next surveillance colonoscopy due April 2028 May use MiraLAX as needed for symptoms of constipation Continue high-fiber diet May use RectiCare as needed for rectal pain, symptoms of anal fissure Lifestyle modification and GERD diet   Planned Follow Up PRN  The patient or caregiver verbalized understanding of the material covered, with no barriers to understanding. All questions were answered. Patient or caregiver is agreeable with the plan outlined above.    It was a pleasure to  see Harry Thomas.  If you have any questions or concerns regarding this evaluation, do not hesitate to contact me.  Inocente Hausen, MD [facility]

## 2023-11-18 ENCOUNTER — Encounter: Payer: Self-pay | Admitting: Pediatrics

## 2023-11-18 ENCOUNTER — Ambulatory Visit (INDEPENDENT_AMBULATORY_CARE_PROVIDER_SITE_OTHER): Admitting: Pediatrics

## 2023-11-18 VITALS — BP 110/68 | HR 64 | Ht 67.0 in | Wt 154.4 lb

## 2023-11-18 DIAGNOSIS — K6289 Other specified diseases of anus and rectum: Secondary | ICD-10-CM

## 2023-11-18 DIAGNOSIS — K219 Gastro-esophageal reflux disease without esophagitis: Secondary | ICD-10-CM

## 2023-11-18 DIAGNOSIS — Z860101 Personal history of adenomatous and serrated colon polyps: Secondary | ICD-10-CM | POA: Diagnosis not present

## 2023-11-18 DIAGNOSIS — K5900 Constipation, unspecified: Secondary | ICD-10-CM | POA: Diagnosis not present

## 2023-11-18 DIAGNOSIS — D126 Benign neoplasm of colon, unspecified: Secondary | ICD-10-CM

## 2023-11-18 DIAGNOSIS — Z831 Family history of other infectious and parasitic diseases: Secondary | ICD-10-CM

## 2023-11-18 NOTE — Patient Instructions (Addendum)
 Seguimiento segn sea necesario.   Thank you for entrusting me with your care and for choosing [facility], Dr. Inocente Hausen  _______________________________________________________  If your blood pressure at your visit was 140/90 or greater, please contact your primary care physician to follow up on this.  _______________________________________________________  If you are age 47 or older, your body mass index should be between 23-30. Your Body mass index is 24.18 kg/m. If this is out of the aforementioned range listed, please consider follow up with your Primary Care Provider.  If you are age 64 or younger, your body mass index should be between 19-25. Your Body mass index is 24.18 kg/m. If this is out of the aformentioned range listed, please consider follow up with your Primary Care Provider.   ________________________________________________________  The [facility] providers would like to encourage you to use MYCHART to communicate with providers for non-urgent requests or questions.  Due to long hold times on the telephone, sending your provider a message by [facility] may be a faster and more efficient way to get a response.  Please allow 48 business hours for a response.  Please remember that this is for non-urgent requests.  _______________________________________________________

## 2023-11-21 ENCOUNTER — Ambulatory Visit: Admitting: Pediatrics

## 2024-04-28 ENCOUNTER — Emergency Department (HOSPITAL_COMMUNITY)

## 2024-04-28 ENCOUNTER — Emergency Department (HOSPITAL_COMMUNITY)
Admission: EM | Admit: 2024-04-28 | Discharge: 2024-04-28 | Disposition: A | Discharge status: Home / Self Care | Attending: Emergency Medicine | Admitting: Emergency Medicine

## 2024-04-28 DIAGNOSIS — U071 COVID-19: Secondary | ICD-10-CM

## 2024-04-28 LAB — RESP PANEL BY RT-PCR (RSV, FLU A&B, COVID)  RVPGX2
Influenza A by PCR: NEGATIVE
Influenza B by PCR: NEGATIVE
Resp Syncytial Virus by PCR: NEGATIVE
SARS Coronavirus 2 by RT PCR: POSITIVE — AB

## 2024-04-28 MED ORDER — BENZONATATE 100 MG PO CAPS: 100.0000 mg | ORAL_CAPSULE | Freq: Three times a day (TID) | ORAL | 0 refills | Status: AC | PRN

## 2024-04-28 NOTE — ED Provider Notes (Signed)
 [place] EMERGENCY DEPARTMENT AT [facility] Provider Note   CSN: [id number] Arrival date & time: 04/28/24  2211     Patient presents with: Fever   Harry Thomas is a 48 y.o. male.  {Add pertinent medical, surgical, social history, OB history to YEP:67052} The history is provided by the patient. A language interpreter was used.  Fever Harry Thomas is a 48 y.o. male who presents to the Emergency Department complaining of *** Fever and cough since Thursday.  Comes and goes Body aches Itchy throat/irritation.  No v/d    Prior to Admission medications   Medication Sig Start Date End Date Taking? Authorizing Provider  acetaminophen  (TYLENOL ) 325 MG tablet Take 2 tablets (650 mg total) by mouth every 6 (six) hours as needed. 06/07/21   Elnor Jayson LABOR, DO  diazepam  (VALIUM ) 5 MG tablet Take 0.5-1 tablets (2.5-5 mg total) by mouth every 6 (six) hours as needed for muscle spasms. 09/05/22   Harris, Abigail, PA-C  ibuprofen  (ADVIL ) 800 MG tablet Take 1 tablet (800 mg total) by mouth every 8 (eight) hours as needed for moderate pain. 09/02/22   Suzette Pac, MD  lidocaine  (LIDODERM ) 5 % Place 1 patch onto the skin daily as needed for up to 15 doses. Remove & Discard patch within 12 hours or as directed by MD 06/07/21   Elnor Jayson A, DO  methocarbamol  (ROBAXIN ) 500 MG tablet Take 1 tablet (500 mg total) by mouth 2 (two) times daily. 06/09/22   Neldon Hamp RAMAN, PA  multivitamin (ONE-A-DAY MEN'S) TABS tablet Take 1 tablet by mouth daily with breakfast.    [provider]  naproxen  (NAPROSYN ) 500 MG tablet Take 1 tablet (500 mg total) by mouth 2 (two) times daily. 09/29/20   Emelia Sluder, PA-C  omega-3 acid ethyl esters (LOVAZA) 1 g capsule Take 2 capsules by mouth 2 (two) times daily. 10/05/23   [provider]  oxyCODONE  (ROXICODONE ) 5 MG immediate release tablet Take 1 tablet (5 mg total) by mouth every 4 (four) hours as needed for severe pain. 06/07/21   Elnor Jayson LABOR, DO  oxyCODONE  (ROXICODONE ) 5 MG immediate release tablet Take 0.5-1 tablets (2.5-5 mg total) by mouth every 4 (four) hours as needed for severe pain. 09/05/22   Harris, Abigail, PA-C    Allergies: Magnesium-containing compounds and Other    Review of Systems  Constitutional:  Positive for fever.    Updated Vital Signs BP 124/75   Pulse 91   Temp 98.7 F (37.1 C) (Oral)   Resp 19   SpO2 99%   Physical Exam  (all labs ordered are listed, but only abnormal results are displayed) Labs Reviewed  RESP PANEL BY RT-PCR (RSV, FLU A&B, COVID)  RVPGX2 - Abnormal; Notable for the following components:      Result Value   SARS Coronavirus 2 by RT PCR POSITIVE (*)    All other components within normal limits    EKG: None  Radiology: DG Chest 2 View Result Date: 04/28/2024 EXAM: 2 VIEW(S) XRAY OF THE CHEST 04/28/2024 10:44:00 PM COMPARISON: 09/02/2022 CLINICAL HISTORY: cough cough FINDINGS: LUNGS AND PLEURA: No focal pulmonary opacity. No pleural effusion. No pneumothorax. HEART AND MEDIASTINUM: No acute abnormality of the cardiac and mediastinal silhouettes. BONES AND SOFT TISSUES: No acute osseous abnormality. IMPRESSION: 1. No acute cardiopulmonary process. Electronically signed by: Franky Crease MD 04/28/2024 10:48 PM EST RP Workstation: [id number]    {Document cardiac monitor, telemetry assessment procedure when appropriate:32947} Procedures  Medications Ordered in the ED - No data to display    {Click here for ABCD2, HEART and other calculators REFRESH Note before signing:1}                              Medical Decision Making Amount and/or Complexity of Data Reviewed Radiology: ordered.   ***  {Document critical care time when appropriate  Document review of labs and clinical decision tools ie CHADS2VASC2, etc  Document your independent review of radiology images and any outside records  Document your discussion with family members, caretakers and with  consultants  Document social determinants of health affecting pt's care  Document your decision making why or why not admission, treatments were needed:32947:::1}   Final diagnoses:  None    ED Discharge Orders     None

## 2024-04-28 NOTE — ED Triage Notes (Signed)
 Patient brought in by POV with c/o fever, cough and congestion for 3 days. Took Tylenol .
